# Patient Record
Sex: Female | Born: 1995 | Race: Black or African American | Hispanic: No | Marital: Single | Smoking: Former smoker
Health system: Southern US, Community
[De-identification: ages and names within clinical notes are randomized; demographics above are authoritative.]

## PROBLEM LIST (undated history)

## (undated) DIAGNOSIS — Z789 Other specified health status: Secondary | ICD-10-CM

## (undated) DIAGNOSIS — Z8619 Personal history of other infectious and parasitic diseases: Secondary | ICD-10-CM

## (undated) DIAGNOSIS — J302 Other seasonal allergic rhinitis: Secondary | ICD-10-CM

## (undated) HISTORY — PX: NO PAST SURGERIES: SHX2092

---
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
## 2017-06-05 DIAGNOSIS — Z8619 Personal history of other infectious and parasitic diseases: Secondary | ICD-10-CM

## 2017-06-05 HISTORY — DX: Personal history of other infectious and parasitic diseases: Z86.19

## 2017-08-29 ENCOUNTER — Encounter (HOSPITAL_COMMUNITY): Payer: Self-pay

## 2017-08-29 ENCOUNTER — Emergency Department (HOSPITAL_COMMUNITY)
Admission: EM | Admit: 2017-08-29 | Discharge: 2017-08-29 | Disposition: A | Discharge status: Home / Self Care | Payer: Medicaid Other | Attending: Emergency Medicine | Admitting: Emergency Medicine

## 2017-08-29 ENCOUNTER — Emergency Department (HOSPITAL_COMMUNITY): Payer: Medicaid Other

## 2017-08-29 ENCOUNTER — Other Ambulatory Visit: Payer: Self-pay

## 2017-08-29 DIAGNOSIS — J029 Acute pharyngitis, unspecified: Secondary | ICD-10-CM | POA: Insufficient documentation

## 2017-08-29 DIAGNOSIS — R079 Chest pain, unspecified: Secondary | ICD-10-CM | POA: Diagnosis not present

## 2017-08-29 DIAGNOSIS — F17210 Nicotine dependence, cigarettes, uncomplicated: Secondary | ICD-10-CM | POA: Diagnosis not present

## 2017-08-29 LAB — CBC WITH DIFFERENTIAL/PLATELET
Basophils Absolute: 0 10*3/uL (ref 0.0–0.1)
Basophils Relative: 0 %
EOS PCT: 1 %
Eosinophils Absolute: 0.1 10*3/uL (ref 0.0–0.7)
HCT: 34 % — ABNORMAL LOW (ref 36.0–46.0)
Hemoglobin: 10 g/dL — ABNORMAL LOW (ref 12.0–15.0)
LYMPHS ABS: 2.1 10*3/uL (ref 0.7–4.0)
LYMPHS PCT: 44 %
MCH: 22.8 pg — AB (ref 26.0–34.0)
MCHC: 29.4 g/dL — ABNORMAL LOW (ref 30.0–36.0)
MCV: 77.4 fL — ABNORMAL LOW (ref 78.0–100.0)
MONO ABS: 0.3 10*3/uL (ref 0.1–1.0)
Monocytes Relative: 7 %
Neutro Abs: 2.2 10*3/uL (ref 1.7–7.7)
Neutrophils Relative %: 48 %
PLATELETS: 303 10*3/uL (ref 150–400)
RBC: 4.39 MIL/uL (ref 3.87–5.11)
RDW: 16 % — ABNORMAL HIGH (ref 11.5–15.5)
WBC: 4.7 10*3/uL (ref 4.0–10.5)

## 2017-08-29 LAB — BASIC METABOLIC PANEL
Anion gap: 11 (ref 5–15)
BUN: <5 mg/dL — ABNORMAL LOW (ref 6–20)
CO2: 23 mmol/L (ref 22–32)
Calcium: 9.9 mg/dL (ref 8.9–10.3)
Chloride: 105 mmol/L (ref 101–111)
Creatinine, Ser: 0.59 mg/dL (ref 0.44–1.00)
GFR calc Af Amer: >60 mL/min (ref >60)
Glucose, Bld: 100 mg/dL — ABNORMAL HIGH (ref 65–99)
POTASSIUM: 4 mmol/L (ref 3.5–5.1)
Sodium: 139 mmol/L (ref 135–145)

## 2017-08-29 LAB — RAPID STREP SCREEN (MED CTR MEBANE ONLY): Streptococcus, Group A Screen (Direct): NEGATIVE

## 2017-08-29 LAB — I-STAT TROPONIN, ED: Troponin i, poc: 0 ng/mL (ref 0.00–0.08)

## 2017-08-29 LAB — I-STAT BETA HCG BLOOD, ED (MC, WL, AP ONLY): I-stat hCG, quantitative: <5 m[IU]/mL (ref <5)

## 2017-08-29 LAB — D-DIMER, QUANTITATIVE (NOT AT ARMC)

## 2017-08-29 MED ORDER — KETOROLAC TROMETHAMINE 30 MG/ML IJ SOLN
30.0000 mg | Freq: Once | INTRAMUSCULAR | Status: AC
  Administered 2017-08-29: 30 mg via INTRAMUSCULAR
  Filled 2017-08-29: qty 1

## 2017-08-29 MED ORDER — DEXAMETHASONE SODIUM PHOSPHATE 10 MG/ML IJ SOLN
10.0000 mg | Freq: Once | INTRAMUSCULAR | Status: AC
  Administered 2017-08-29: 10 mg via INTRAMUSCULAR
  Filled 2017-08-29: qty 1

## 2017-08-29 MED ORDER — KETOROLAC TROMETHAMINE 30 MG/ML IJ SOLN
30.0000 mg | Freq: Once | INTRAMUSCULAR | Status: DC
Start: 2017-08-29 — End: 2017-08-29

## 2017-08-29 MED ORDER — DEXAMETHASONE SODIUM PHOSPHATE 10 MG/ML IJ SOLN: 10.0000 mg | Freq: Once | INTRAMUSCULAR | Status: DC

## 2017-08-29 NOTE — ED Notes (Signed)
Attempted IV X2 for blood draw, unable to obtain.

## 2017-08-29 NOTE — Discharge Instructions (Signed)
You can take Tylenol or Ibuprofen as directed for pain. You can alternate Tylenol and Ibuprofen every 4 hours. If you take Tylenol at 1pm, then you can take Ibuprofen at 5pm. Then you can take Tylenol again at 9pm.   [facility].   Return to the Emergency Department immediately if you experiencing worsening chest pain, difficulty breathing, nausea/vomiting, get very sweaty, headache or any other worsening or concerning symptoms.

## 2017-08-29 NOTE — ED Provider Notes (Signed)
[facility] EMERGENCY DEPARTMENT Provider Note   CSN: [id number] Arrival date & time: 08/29/17  1416     History   Chief Complaint No chief complaint on file.   HPI Christina Cummings is a 22 y.o. female who presents for evaluation of chest pain that is been ongoing for 2 days and sore throat that is been ongoing for the last week. Patient states that sore throat has been constant.  She took ibuprofen and Goody's powder with minimal improvement in symptoms.  Patient states that she is able to swallow and tolerate her secretions but does report worsening pain with swallowing.  Patient also comes because she has been having midsternal chest pain that is been ongoing since yesterday.  She states that the pain has been constant in nature.  She states that it is worse with movement.  Patient states that it is also worse when she takes a deep breath in.  Patient states she has not taken anything for the pain.  She denies any associated nausea, vomiting, diaphoresis.  Patient reports that she did smoke marijuana yesterday prior to onset of symptoms.  She denies any cocaine, heroin, marijuana use.  Patient denies any fevers, abdominal pain, nausea/vomiting, numbness/weakness of arms or legs.  denies any OCP use, recent immobilization, prior history of DVT/PE, recent surgery, leg swelling, or long travel.  The history is provided by the patient.    Past Medical History:  Diagnosis Date  . Seasonal allergies     There are no active problems to display for this patient.   History reviewed. No pertinent surgical history.   OB History   None      Home Medications    Prior to Admission medications   Medication Sig Start Date End Date Taking? Authorizing Provider  promethazine (PHENERGAN) 25 MG tablet Take 1 tablet (25 mg total) by mouth every 6 (six) hours as needed for nausea or vomiting. 08/04/16   Antony Madura, PA-C    Family History No family history on file.  Social  History Social History   Tobacco Use  . Smoking status: Current Every Day Smoker    Packs/day: 0.50    Types: Cigarettes  . Smokeless tobacco: Never Used  Substance Use Topics  . Alcohol use: No  . Drug use: No     Allergies   Patient has no known allergies.   Review of Systems Review of Systems  Constitutional: Negative for chills and fever.  HENT: Positive for sore throat. Negative for congestion and rhinorrhea.   Eyes: Negative for visual disturbance.  Respiratory: Negative for cough and shortness of breath.   Cardiovascular: Positive for chest pain.  Gastrointestinal: Negative for abdominal pain, diarrhea, nausea and vomiting.  Genitourinary: Negative for dysuria and hematuria.  Skin: Negative for rash.  Neurological: Negative for dizziness, weakness, numbness and headaches.  Psychiatric/Behavioral: Negative for confusion.  All other systems reviewed and are negative.    Physical Exam Updated Vital Signs BP 95/75 (BP Location: Right Arm)   Pulse 96   Temp 98.3 F (36.8 C) (Oral)   Resp 18   SpO2 98%   Physical Exam  Constitutional: She is oriented to person, place, and time. She appears well-developed and well-nourished.  HENT:  Head: Normocephalic and atraumatic.  Mouth/Throat: Uvula is midline and mucous membranes are normal. No trismus in the jaw. Posterior oropharyngeal erythema present.  Uvula is midline.  No trismus.  Oropharynx is erythematous but no edema, exudates.  No neck or  facial swelling.  Airway is patent, phonation is intact.  Eyes: Pupils are equal, round, and reactive to light. Conjunctivae, EOM and lids are normal.  Neck: Full passive range of motion without pain.  Cardiovascular: Normal rate, regular rhythm, normal heart sounds and normal pulses. Exam reveals no gallop and no friction rub.  No murmur heard. Pulmonary/Chest: Effort normal and breath sounds normal.  No evidence of respiratory distress. Able to speak in full sentences without  difficulty.  Abdominal: Soft. Normal appearance. There is no tenderness. There is no rigidity and no guarding.  Musculoskeletal: Normal range of motion.  Neurological: She is alert and oriented to person, place, and time.  Skin: Skin is warm and dry. Capillary refill takes less than 2 seconds.  Psychiatric: She has a normal mood and affect. Her speech is normal.  Nursing note and vitals reviewed.    ED Treatments / Results  Labs (all labs ordered are listed, but only abnormal results are displayed) Labs Reviewed  CBC WITH DIFFERENTIAL/PLATELET - Abnormal; Notable for the following components:      Result Value   Hemoglobin 10.0 (*)    HCT 34.0 (*)    MCV 77.4 (*)    MCH 22.8 (*)    MCHC 29.4 (*)    RDW 16.0 (*)    All other components within normal limits  BASIC METABOLIC PANEL - Abnormal; Notable for the following components:   Glucose, Bld 100 (*)    BUN <5 (*)    All other components within normal limits  RAPID STREP SCREEN (NOT AT [facility])  CULTURE, GROUP A STREP ([facility])  D-DIMER, QUANTITATIVE (NOT AT [facility])  I-STAT TROPONIN, ED  I-STAT BETA HCG BLOOD, ED ([facility], [facility], [facility] ONLY)    EKG EKG Interpretation  Date/Time:  Wednesday August 29 2017 14:32:09 EDT Ventricular Rate:  105 PR Interval:  142 QRS Duration: 84 QT Interval:  314 QTC Calculation: 415 R Axis:   55 Text Interpretation:  Sinus tachycardia Otherwise normal ECG since last tracing no significant change Confirmed by Mancel Bale [phone]) on 08/29/2017 9:25:20 PM   Radiology Dg Chest 2 View  Result Date: 08/29/2017 CLINICAL DATA:  Chest pain EXAM: CHEST - 2 VIEW COMPARISON:  02/03/2015 FINDINGS: The heart size and mediastinal contours are within normal limits. Both lungs are clear. The visualized skeletal structures are unremarkable. IMPRESSION: No active cardiopulmonary disease. Electronically Signed   By: Alcide Clever M.D.   On: 08/29/2017 18:51    Procedures Procedures (including critical care  time)  Medications Ordered in ED Medications  dexamethasone (DECADRON) injection 10 mg (10 mg Intramuscular Given 08/29/17 2129)  ketorolac (TORADOL) 30 MG/ML injection 30 mg (30 mg Intramuscular Given 08/29/17 2129)     Initial Impression / Assessment and Plan / ED Course  I have reviewed the triage vital signs and the nursing notes.  Pertinent labs & imaging results that were available during my care of the patient were reviewed by me and considered in my medical decision making (see chart for details).     22 y.o. Female who presents for evaluation of sore throat and chest pain.  Sore throat has been going on for a week and chest pain began yesterday.  Worse with movement and deep inspiration.  Reports some associated shortness of breath.  No fevers, nausea/vomiting. Patient is afebrile , non-toxic appearing, sitting comfortably on examination table. Vital signs reviewed and stable.  Posterior oropharynx is slightly erythematous but no evidence of exudates, edema.  No neck or  facial swelling.  History/physical exam is not concerning for Ludwig angina or peritonsillar abscess.  Consider pharyngitis versus tonsillitis.  On exam, Consider musculoskeletal pain versus anxiety versus ACS etiology versus acute infectious etiology.  Low suspicion for PE, though a consideration.  Plan to check basic labs, EKG, chest x-ray.  Rapid strep ordered at triage.  I-STAT beta negative.  Troponin negative.  D-dimer is negative.  CBC shows slight anemia but no evidence of leukocytosis.  Records reviewed to the patient has a baseline history of anemia.  BMP is without any significant acute abnormalities.  Chest x-ray negative for any acute abnormalities.  Rapid strep negative.  Given the patient's symptoms have been constant since yesterday, one troponin is sufficient for ACS rule out.  Doubt ACS etiology as patient has no risk factors.   Discussed results with patient.  Encouraged at home supportive therapy care  measures with patient.  Patient instructed follow-up with her primary care doctor next 24 to 48 hours for further evaluation. Patient had ample opportunity for questions and discussion. All patient's questions were answered with full understanding. Strict return precautions discussed. Patient expresses understanding and agreement to plan.   Final Clinical Impressions(s) / ED Diagnoses   Final diagnoses:  Chest pain, unspecified type    ED Discharge Orders    None       Rosana HoesLayden, Lindsey A, PA-C 08/30/17 Dyann [id number]    Wentz, Elliott, MD 09/01/17 1729

## 2017-08-29 NOTE — ED Triage Notes (Signed)
Patient complains of sore throat x 2 days with sharp cp with movement and inspiration. Alert and oriented, nad

## 2017-08-31 LAB — CULTURE, GROUP A STREP (THRC)

## 2017-10-17 ENCOUNTER — Encounter (HOSPITAL_COMMUNITY): Payer: Self-pay | Admitting: *Deleted

## 2017-10-17 ENCOUNTER — Inpatient Hospital Stay (HOSPITAL_COMMUNITY)
Admission: AD | Admit: 2017-10-17 | Discharge: 2017-10-17 | Disposition: A | Discharge status: Home / Self Care | Payer: Medicaid Other | Source: Ambulatory Visit | Attending: Family Medicine | Admitting: Family Medicine

## 2017-10-17 ENCOUNTER — Telehealth: Payer: Self-pay | Admitting: Licensed Clinical Social Worker

## 2017-10-17 DIAGNOSIS — F17210 Nicotine dependence, cigarettes, uncomplicated: Secondary | ICD-10-CM | POA: Diagnosis not present

## 2017-10-17 DIAGNOSIS — N760 Acute vaginitis: Secondary | ICD-10-CM | POA: Insufficient documentation

## 2017-10-17 DIAGNOSIS — Z202 Contact with and (suspected) exposure to infections with a predominantly sexual mode of transmission: Secondary | ICD-10-CM | POA: Diagnosis present

## 2017-10-17 DIAGNOSIS — B9689 Other specified bacterial agents as the cause of diseases classified elsewhere: Secondary | ICD-10-CM | POA: Diagnosis not present

## 2017-10-17 HISTORY — DX: Personal history of other infectious and parasitic diseases: Z86.19

## 2017-10-17 LAB — URINALYSIS, ROUTINE W REFLEX MICROSCOPIC
Bilirubin Urine: NEGATIVE
GLUCOSE, UA: NEGATIVE mg/dL
Hgb urine dipstick: NEGATIVE
Ketones, ur: NEGATIVE mg/dL
Nitrite: NEGATIVE
PROTEIN: NEGATIVE mg/dL
SPECIFIC GRAVITY, URINE: 1.018 (ref 1.005–1.030)
pH: 7 (ref 5.0–8.0)

## 2017-10-17 LAB — WET PREP, GENITAL
Sperm: NONE SEEN
Trich, Wet Prep: NONE SEEN
YEAST WET PREP: NONE SEEN

## 2017-10-17 LAB — POCT PREGNANCY, URINE: Preg Test, Ur: NEGATIVE

## 2017-10-17 LAB — RPR: RPR Ser Ql: NONREACTIVE

## 2017-10-17 LAB — GC/CHLAMYDIA PROBE AMP (~~LOC~~) NOT AT ARMC
Chlamydia: NEGATIVE
Neisseria Gonorrhea: NEGATIVE

## 2017-10-17 LAB — HIV ANTIBODY (ROUTINE TESTING W REFLEX): HIV Screen 4th Generation wRfx: NONREACTIVE

## 2017-10-17 MED ORDER — METRONIDAZOLE 500 MG PO TABS
500.0000 mg | ORAL_TABLET | Freq: Two times a day (BID) | ORAL | 0 refills | Status: DC
Start: 2017-10-17 — End: 2019-11-19

## 2017-10-17 MED ORDER — AZITHROMYCIN 250 MG PO TABS
1000.0000 mg | ORAL_TABLET | Freq: Once | ORAL | Status: AC
  Administered 2017-10-17: 1000 mg via ORAL
  Filled 2017-10-17: qty 4

## 2017-10-17 MED ORDER — CEFTRIAXONE SODIUM 250 MG IJ SOLR
250.0000 mg | Freq: Once | INTRAMUSCULAR | Status: AC
  Administered 2017-10-17: 250 mg via INTRAMUSCULAR
  Filled 2017-10-17: qty 250

## 2017-10-17 NOTE — MAU Provider Note (Signed)
History     CSN: [id number]  Arrival date and time: 10/17/17 9528   First Provider Initiated Contact with Patient 10/17/17 0220      Chief Complaint  Patient presents with  . Exposure to STD   HPI Christina Cummings is a 22 y.o. non pregnant female who presents for STD exposure. Reports having intercourse with a new partner on Friday. Doesn't not know him well and did not use condom. Was told today by his friends that he has an STD. Does not know what STD he has. Since Monday she has had some thick white vaginal discharge and irritation. Denies abdominal pain or vaginal bleeding. Hx of gonorrhea 5 months ago. Does not use any contraception.   Past Medical History:  Diagnosis Date  . Seasonal allergies     No past surgical history on file.  No family history on file.  Social History   Tobacco Use  . Smoking status: Current Every Day Smoker    Packs/day: 0.50    Types: Cigarettes  . Smokeless tobacco: Never Used  Substance Use Topics  . Alcohol use: No  . Drug use: No    Allergies: No Known Allergies  Medications Prior to Admission  Medication Sig Dispense Refill Last Dose  . promethazine (PHENERGAN) 25 MG tablet Take 1 tablet (25 mg total) by mouth every 6 (six) hours as needed for nausea or vomiting. 12 tablet 0     Review of Systems  Constitutional: Negative.   Gastrointestinal: Negative.   Genitourinary: Positive for vaginal discharge. Negative for genital sores.   Physical Exam   Blood pressure 110/72, pulse 88, temperature 98.6 F (37 C), temperature source Oral, resp. rate 20, height  (1.727 m), weight 181 lb (82.1 kg), last menstrual period 10/05/2017.  Physical Exam  Nursing note and vitals reviewed. Constitutional: She is oriented to person, place, and time. She appears well-developed and well-nourished. No distress.  HENT:  Head: Normocephalic and atraumatic.  Eyes: Conjunctivae are normal. Right eye exhibits no discharge. Left eye exhibits no  discharge. No scleral icterus.  Neck: Normal range of motion.  Respiratory: Effort normal. No respiratory distress.  Neurological: She is alert and oriented to person, place, and time.  Skin: She is not diaphoretic.  Psychiatric: She has a normal mood and affect. Her behavior is normal. Judgment and thought content normal.    MAU Course  Procedures Results for orders placed or performed during the hospital encounter of 10/17/17 (from the past 24 hour(s))  Urinalysis, Routine w reflex microscopic     Status: Abnormal   Collection Time: 10/17/17  1:35 AM  Result Value Ref Range   Color, Urine YELLOW YELLOW   APPearance HAZY (A) CLEAR   Specific Gravity, Urine 1.018 1.005 - 1.030   pH 7.0 5.0 - 8.0   Glucose, UA NEGATIVE NEGATIVE mg/dL   Hgb urine dipstick NEGATIVE NEGATIVE   Bilirubin Urine NEGATIVE NEGATIVE   Ketones, ur NEGATIVE NEGATIVE mg/dL   Protein, ur NEGATIVE NEGATIVE mg/dL   Nitrite NEGATIVE NEGATIVE   Leukocytes, UA TRACE (A) NEGATIVE   RBC / HPF 0-5 0 - 5 RBC/hpf   WBC, UA 0-5 0 - 5 WBC/hpf   Bacteria, UA RARE (A) NONE SEEN   Squamous Epithelial / LPF 6-10 0 - 5   Mucus PRESENT   Wet prep, genital     Status: Abnormal   Collection Time: 10/17/17  1:35 AM  Result Value Ref Range   Yeast Wet Prep HPF POC  NONE SEEN NONE SEEN   Trich, Wet Prep NONE SEEN NONE SEEN   Clue Cells Wet Prep HPF POC PRESENT (A) NONE SEEN   WBC, Wet Prep HPF POC MODERATE (A) NONE SEEN   Sperm NONE SEEN   Pregnancy, urine POC     Status: None   Collection Time: 10/17/17  1:42 AM  Result Value Ref Range   Preg Test, Ur NEGATIVE NEGATIVE    MDM UPT negative GC/CT, wet prep, HIV, RPR  Offered patient GC/CT treatment -- pt requests tx tonight.  Wet prep negative for trich but + BV Discussed LARC with patient as she is 22 yrs old and lives in [place]. Patient is interested.   Assessment and Plan  A: 1. STD exposure   2. BV (bacterial vaginosis)    P: Discharge home Rx  flagyl GC/CT, HIV, RPR pending Candidate for teen LARC -- msg sent to Sue Lush No intercourse x 2 weeks Discussed safe sex practices & use of condoms  Judeth Horn 10/17/2017, 2:20 AM

## 2017-10-17 NOTE — Discharge Instructions (Signed)
Contraception Choices Contraception, also called birth control, refers to methods or devices that prevent pregnancy. Hormonal methods Contraceptive implant A contraceptive implant is a thin, plastic tube that contains a hormone. It is inserted into the upper part of the arm. It can remain in place for up to 3 years. Progestin-only injections Progestin-only injections are injections of progestin, a synthetic form of the hormone progesterone. They are given every 3 months by a health care provider. Birth control pills Birth control pills are pills that contain hormones that prevent pregnancy. They must be taken once a day, preferably at the same time each day. Birth control patch The birth control patch contains hormones that prevent pregnancy. It is placed on the skin and must be changed once a week for three weeks and removed on the fourth week. A prescription is needed to use this method of contraception. Vaginal ring A vaginal ring contains hormones that prevent pregnancy. It is placed in the vagina for three weeks and removed on the fourth week. After that, the process is repeated with a new ring. A prescription is needed to use this method of contraception. Emergency contraceptive Emergency contraceptives prevent pregnancy after unprotected sex. They come in pill form and can be taken up to 5 days after sex. They work best the sooner they are taken after having sex. Most emergency contraceptives are available without a prescription. This method should not be used as your only form of birth control. Barrier methods Female condom A female condom is a thin sheath that is worn over the penis during sex. Condoms keep sperm from going inside a woman's body. They can be used with a spermicide to increase their effectiveness. They should be disposed after a single use. Female condom A female condom is a soft, loose-fitting sheath that is put into the vagina before sex. The condom keeps sperm from going  inside a woman's body. They should be disposed after a single use. Diaphragm A diaphragm is a soft, dome-shaped barrier. It is inserted into the vagina before sex, along with a spermicide. The diaphragm blocks sperm from entering the uterus, and the spermicide kills sperm. A diaphragm should be left in the vagina for 6-8 hours after sex and removed within 24 hours. A diaphragm is prescribed and fitted by a health care provider. A diaphragm should be replaced every 1-2 years, after giving birth, after gaining more than 15 lb (6.8 kg), and after pelvic surgery. Cervical cap A cervical cap is a round, soft latex or plastic cup that fits over the cervix. It is inserted into the vagina before sex, along with spermicide. It blocks sperm from entering the uterus. The cap should be left in place for 6-8 hours after sex and removed within 48 hours. A cervical cap must be prescribed and fitted by a health care provider. It should be replaced every 2 years. Sponge A sponge is a soft, circular piece of polyurethane foam with spermicide on it. The sponge helps block sperm from entering the uterus, and the spermicide kills sperm. To use it, you make it wet and then insert it into the vagina. It should be inserted before sex, left in for at least 6 hours after sex, and removed and thrown away within 30 hours. Spermicides Spermicides are chemicals that kill or block sperm from entering the cervix and uterus. They can come as a cream, jelly, suppository, foam, or tablet. A spermicide should be inserted into the vagina with an applicator at least 10-27 minutes before  sex to allow time for it to work. The process must be repeated every time you have sex. Spermicides do not require a prescription. Intrauterine contraception Intrauterine device (IUD) An IUD is a T-shaped device that is put in a woman's uterus. There are two types:  Hormone IUD.This type contains progestin, a synthetic form of the hormone progesterone. This  type can stay in place for 3-5 years.  Copper IUD.This type is wrapped in copper wire. It can stay in place for 10 years.  Permanent methods of contraception Female tubal ligation In this method, a woman's fallopian tubes are sealed, tied, or blocked during surgery to prevent eggs from traveling to the uterus. Hysteroscopic sterilization In this method, a small, flexible insert is placed into each fallopian tube. The inserts cause scar tissue to form in the fallopian tubes and block them, so sperm cannot reach an egg. The procedure takes about 3 months to be effective. Another form of birth control must be used during those 3 months. Female sterilization This is a procedure to tie off the tubes that carry sperm (vasectomy). After the procedure, the man can still ejaculate fluid (semen). Natural planning methods Natural family planning In this method, a couple does not have sex on days when the woman could become pregnant. Calendar method This means keeping track of the length of each menstrual cycle, identifying the days when pregnancy can happen, and not having sex on those days. Ovulation method In this method, a couple avoids sex during ovulation. Symptothermal method This method involves not having sex during ovulation. The woman typically checks for ovulation by watching changes in her temperature and in the consistency of cervical mucus. Post-ovulation method In this method, a couple waits to have sex until after ovulation. Summary  Contraception, also called birth control, means methods or devices that prevent pregnancy.  Hormonal methods of contraception include implants, injections, pills, patches, vaginal rings, and emergency contraceptives.  Barrier methods of contraception can include female condoms, female condoms, diaphragms, cervical caps, sponges, and spermicides.  There are two types of IUDs (intrauterine devices). An IUD can be put in a woman's uterus to prevent pregnancy  for 3-5 years.  Permanent sterilization can be done through a procedure for males, females, or both.  Natural family planning methods involve not having sex on days when the woman could become pregnant. This information is not intended to replace advice given to you by your health care provider. Make sure you discuss any questions you have with your health care provider. Document Released: 05/22/2005 Document Revised: 06/24/2016 Document Reviewed: 06/24/2016 Elsevier Interactive Patient Education  2018 [employer] Inc[employer]      Bacterial Vaginosis Bacterial vaginosis is an infection of the vagina. It happens when too many germs (bacteria) grow in the vagina. This infection puts you at risk for infections from sex (STIs). Treating this infection can lower your risk for some STIs. You should also treat this if you are pregnant. It can cause your baby to be born early. Follow these instructions at home: Medicines  Take over-the-counter and prescription medicines only as told by your doctor.  Take or use your antibiotic medicine as told by your doctor. Do not stop taking or using it even if you start to feel better. General instructions  If you your sexual partner is a woman, tell her that you have this infection. She needs to get treatment if she has symptoms. If you have a female partner, he does not need to be treated.  During  treatment: ? Avoid sex. ? Do not douche. ? Avoid alcohol as told. ? Avoid breastfeeding as told.  Drink enough fluid to keep your pee (urine) clear or pale yellow.  Keep your vagina and butt (rectum) clean. ? Wash the area with warm water every day. ? Wipe from front to back after you use the toilet.  Keep all follow-up visits as told by your doctor. This is important. Preventing this condition  Do not douche.  Use only warm water to wash around your vagina.  Use protection when you have sex. This includes: ? Latex condoms. ? Dental dams.  Limit how many  people you have sex with. It is best to only have sex with the same person (be monogamous).  Get tested for STIs. Have your partner get tested.  Wear underwear that is cotton or lined with cotton.  Avoid tight pants and pantyhose. This is most important in summer.  Do not use any products that have nicotine or tobacco in them. These include cigarettes and e-cigarettes. If you need help quitting, ask your doctor.  Do not use illegal drugs.  Limit how much alcohol you drink. Contact a doctor if:  Your symptoms do not get better, even after you are treated.  You have more discharge or pain when you pee (urinate).  You have a fever.  You have pain in your belly (abdomen).  You have pain with sex.  Your bleed from your vagina between periods. Summary  This infection happens when too many germs (bacteria) grow in the vagina.  Treating this condition can lower your risk for some infections from sex (STIs).  You should also treat this if you are pregnant. It can cause early (premature) birth.  Do not stop taking or using your antibiotic medicine even if you start to feel better. This information is not intended to replace advice given to you by your health care provider. Make sure you discuss any questions you have with your health care provider. Document Released: 02/29/2008 Document Revised: 02/05/2016 Document Reviewed: 02/05/2016 Elsevier Interactive Patient Education  2017 [employer].    Safe Sex Practicing safe sex means taking steps before and during sex to reduce your risk of:  Getting an STD (sexually transmitted disease).  Giving your partner an STD.  Unwanted pregnancy.  How can I practice safe sex?  To practice safe sex:  Limit your sexual partners to only one partner who is having sex with only you.  Avoid using alcohol and recreational drugs before having sex. These substances can affect your judgment.  Before having sex with a new partner: ? Talk  to your partner about past partners, past STDs, and drug use. ? You and your partner should be screened for STDs and discuss the results with each other.  Check your body regularly for sores, blisters, rashes, or unusual discharge. If you notice any of these problems, visit your health care provider.  If you have symptoms of an infection or you are being treated for an STD, avoid sexual contact.  While having sex, use a condom. Make sure to: ? Use a condom every time you have vaginal, oral, or anal sex. Both females and males should wear condoms during oral sex. ? Keep condoms in place from the beginning to the end of sexual activity. ? Use a latex condom, if possible. Latex condoms offer the best protection. ? Use only water-based lubricants or oils to lubricate a condom. Using petroleum-based lubricants or oils will weaken the  condom and increase the chance that it will break.  See your health care provider for regular screenings, exams, and tests for STDs.  Talk with your health care provider about the form of birth control (contraception) that is best for you.  Get vaccinated against hepatitis B and human papillomavirus (HPV).  If you are at risk of being infected with HIV (human immunodeficiency virus), talk with your health care provider about taking a prescription medicine to prevent HIV infection. You are considered at risk for HIV if: ? You are a man who has sex with other men. ? You are a heterosexual man or woman who is sexually active with more than one partner. ? You take drugs by injection. ? You are sexually active with a partner who has HIV.  This information is not intended to replace advice given to you by your health care provider. Make sure you discuss any questions you have with your health care provider. Document Released: 06/29/2004 Document Revised: 10/06/2015 Document Reviewed: 04/11/2015 Elsevier Interactive Patient Education  [employer].

## 2017-10-17 NOTE — MAU Note (Signed)
PT SAYS  SHE STARTED HAVING VAG ITCHING ON Monday.  NO MEDS.   SAYS  SHE HAD SEX ON Friday  WITH SOME MAN- HIS FRIENDS  TOLD HER TODAY - THAT HE HAD STD-  UNSURE OF WHICH .    PT SAYS SHE HAS NOT HAD SEX  WITH ANYONE SINCE .

## 2017-10-17 NOTE — Telephone Encounter (Signed)
CSW A. Felton Clinton contacted pt regarding recent visit to MAU for possible STD exposure. Message received from MAU provider Estanislado Spire regarding pt interest in LARC contraception. Pt reports a desire for larc however due to recent infection it is best pt waits until infections clears then make appt within two weeks for f/u and further discussion regarding LARC.

## 2017-11-01 ENCOUNTER — Telehealth: Payer: Self-pay | Admitting: Licensed Clinical Social Worker

## 2017-11-01 NOTE — Telephone Encounter (Signed)
Left detailed message regarding follow up visit scheduled for 11/05/2017 

## 2017-11-05 ENCOUNTER — Other Ambulatory Visit: Payer: Self-pay | Admitting: Obstetrics and Gynecology

## 2017-11-05 ENCOUNTER — Ambulatory Visit: Payer: Medicaid Other

## 2017-11-05 MED ORDER — CYCLOBENZAPRINE HCL 10 MG PO TABS: 10.0000 mg | ORAL_TABLET | Freq: Once | ORAL | 0 refills | Status: AC

## 2017-11-05 NOTE — Progress Notes (Signed)
Flexeril 10 mg PO to be taken 3-4 hours prior to IUD placement   Rasch, Harolyn RutherfordJennifer I, NP 11/05/2017 10:51 AM

## 2017-11-16 ENCOUNTER — Telehealth: Payer: Self-pay

## 2017-11-16 NOTE — Telephone Encounter (Signed)
Called pt left message for pt to return call to reschedule No show appt today with Antony Odeaaroline Neil for GYN and IUD.

## 2017-12-07 ENCOUNTER — Emergency Department (HOSPITAL_COMMUNITY)
Admission: EM | Admit: 2017-12-07 | Discharge: 2017-12-07 | Disposition: A | Discharge status: Home / Self Care | Payer: Medicaid Other | Attending: Emergency Medicine | Admitting: Emergency Medicine

## 2017-12-07 ENCOUNTER — Encounter (HOSPITAL_COMMUNITY): Payer: Self-pay | Admitting: *Deleted

## 2017-12-07 DIAGNOSIS — R21 Rash and other nonspecific skin eruption: Secondary | ICD-10-CM | POA: Diagnosis present

## 2017-12-07 DIAGNOSIS — Z87891 Personal history of nicotine dependence: Secondary | ICD-10-CM | POA: Insufficient documentation

## 2017-12-07 NOTE — Discharge Instructions (Signed)
Should symptoms fail to resolve, follow-up with a primary care provider or dermatologist.

## 2017-12-07 NOTE — ED Provider Notes (Signed)
[facility]-EMERGENCY DEPT Provider Note   CSN: [id number] Arrival date & time: 12/07/17  1712     History   Chief Complaint Chief Complaint  Patient presents with  . Rash    HPI Christina Cummings is a 22 y.o. female.  HPI   Christina RaymondChekia L Oetken is a 22 y.o. female, presenting to the ED with areas of increased pigment over the last 2 weeks to the chest, arms, and legs.   She denies any medications, soaps, or skin care products.  However, she has began using a new make-up.  Denies hormone therapy. Denies fever, pain, itching, difficulty breathing or swallowing, cough, or any other complaints.   Past Medical History:  Diagnosis Date  . Hx of gonorrhea 06/2017  . Seasonal allergies     There are no active problems to display for this patient.   History reviewed. No pertinent surgical history.   OB History    Gravida  0   Para  0   Term  0   Preterm  0   AB  0   Living  0     SAB  0   TAB  0   Ectopic  0   Multiple  0   Live Births  0            Home Medications    Prior to Admission medications   Medication Sig Start Date End Date Taking? Authorizing Provider  metroNIDAZOLE (FLAGYL) 500 MG tablet Take 1 tablet (500 mg total) by mouth 2 (two) times daily. 10/17/17   Judeth HornLawrence, Erin, NP  promethazine (PHENERGAN) 25 MG tablet Take 1 tablet (25 mg total) by mouth every 6 (six) hours as needed for nausea or vomiting. 08/04/16   Antony MaduraHumes, Kelly, PA-C    Family History No family history on file.  Social History Social History   Tobacco Use  . Smoking status: Former Smoker    Packs/day: 0.50    Types: Cigarettes  . Smokeless tobacco: Never Used  Substance Use Topics  . Alcohol use: No  . Drug use: No     Allergies   Patient has no known allergies.   Review of Systems Review of Systems  Constitutional: Negative for fever.  Skin: Positive for rash.     Physical Exam Updated Vital Signs BP 120/77   Pulse (!) 109   Temp  98.6 F (37 C) (Oral)   Resp 18   LMP 11/27/2017   SpO2 97%   Physical Exam  Constitutional: She appears well-developed and well-nourished. No distress.  HENT:  Head: Normocephalic and atraumatic.  Eyes: Conjunctivae are normal.  Neck: Neck supple.  Cardiovascular: Normal rate and regular rhythm.  Not tachycardic on my exam.  Pulmonary/Chest: Effort normal.  Lymphadenopathy:    She has no cervical adenopathy.  Neurological: She is alert.  Skin: Skin is warm and dry. She is not diaphoretic. No pallor.  I am only able to discern one pinpoint area of hyperpigmentation.  In the other regions the patient pointed out, I cannot discern any abnormalities.  Psychiatric: She has a normal mood and affect. Her behavior is normal.  Nursing note and vitals reviewed.    ED Treatments / Results  Labs (all labs ordered are listed, but only abnormal results are displayed) Labs Reviewed - No data to display  EKG None  Radiology No results found.  Procedures Procedures (including critical care time)  Medications Ordered in ED Medications - No data to display  Initial Impression / Assessment and Plan / ED Course  I have reviewed the triage vital signs and the nursing notes.  Pertinent labs & imaging results that were available during my care of the patient were reviewed by me and considered in my medical decision making (see chart for details).     Patient presents with what she refers to as a rash.  She has no additional symptoms with it.  I had no significantly discernible findings on my exam.  PCP versus dermatology follow-up.  Final Clinical Impressions(s) / ED Diagnoses   Final diagnoses:  Rash    ED Discharge Orders    None       Concepcion Living 12/07/17 2159    Little, Ambrose Finland, MD 12/09/17 1650

## 2017-12-07 NOTE — ED Triage Notes (Signed)
Pt complains of hives all over her body for the past 2 weeks. Pt denies any new cosmetics or detergents. Pt denies itching.

## 2018-04-17 ENCOUNTER — Emergency Department (HOSPITAL_COMMUNITY)
Admission: EM | Admit: 2018-04-17 | Discharge: 2018-04-17 | Disposition: A | Discharge status: Home / Self Care | Payer: Medicaid Other | Attending: Emergency Medicine | Admitting: Emergency Medicine

## 2018-04-17 ENCOUNTER — Encounter (HOSPITAL_COMMUNITY): Payer: Self-pay | Admitting: Emergency Medicine

## 2018-04-17 ENCOUNTER — Other Ambulatory Visit: Payer: Self-pay

## 2018-04-17 DIAGNOSIS — E860 Dehydration: Secondary | ICD-10-CM

## 2018-04-17 DIAGNOSIS — R110 Nausea: Secondary | ICD-10-CM | POA: Insufficient documentation

## 2018-04-17 DIAGNOSIS — Z87891 Personal history of nicotine dependence: Secondary | ICD-10-CM | POA: Diagnosis not present

## 2018-04-17 DIAGNOSIS — R42 Dizziness and giddiness: Secondary | ICD-10-CM | POA: Diagnosis present

## 2018-04-17 DIAGNOSIS — D509 Iron deficiency anemia, unspecified: Secondary | ICD-10-CM

## 2018-04-17 LAB — COMPREHENSIVE METABOLIC PANEL
ALBUMIN: 4.7 g/dL (ref 3.5–5.0)
ALT: 10 U/L (ref 0–44)
AST: 22 U/L (ref 15–41)
Alkaline Phosphatase: 44 U/L (ref 38–126)
Anion gap: 8 (ref 5–15)
BUN: 11 mg/dL (ref 6–20)
CHLORIDE: 101 mmol/L (ref 98–111)
CO2: 25 mmol/L (ref 22–32)
Calcium: 9.2 mg/dL (ref 8.9–10.3)
Creatinine, Ser: 0.77 mg/dL (ref 0.44–1.00)
GFR calc non Af Amer: >60 mL/min (ref >60)
Glucose, Bld: 100 mg/dL — ABNORMAL HIGH (ref 70–99)
Potassium: 3.5 mmol/L (ref 3.5–5.1)
SODIUM: 134 mmol/L — AB (ref 135–145)
Total Bilirubin: 0.4 mg/dL (ref 0.3–1.2)
Total Protein: 8.8 g/dL — ABNORMAL HIGH (ref 6.5–8.1)

## 2018-04-17 LAB — URINALYSIS, ROUTINE W REFLEX MICROSCOPIC
Bilirubin Urine: NEGATIVE
GLUCOSE, UA: NEGATIVE mg/dL
Hgb urine dipstick: NEGATIVE
KETONES UR: 5 mg/dL — AB
Leukocytes, UA: NEGATIVE
NITRITE: NEGATIVE
PH: 6 (ref 5.0–8.0)
Protein, ur: 30 mg/dL — AB
Specific Gravity, Urine: 1.021 (ref 1.005–1.030)

## 2018-04-17 LAB — CBC WITH DIFFERENTIAL/PLATELET
ABS IMMATURE GRANULOCYTES: 0.02 10*3/uL (ref 0.00–0.07)
BASOS ABS: 0 10*3/uL (ref 0.0–0.1)
Basophils Relative: 1 %
Eosinophils Absolute: 0.1 10*3/uL (ref 0.0–0.5)
Eosinophils Relative: 2 %
HCT: 36.4 % (ref 36.0–46.0)
HEMOGLOBIN: 10.3 g/dL — AB (ref 12.0–15.0)
IMMATURE GRANULOCYTES: 0 %
LYMPHS PCT: 25 %
Lymphs Abs: 1.1 10*3/uL (ref 0.7–4.0)
MCH: 22.1 pg — ABNORMAL LOW (ref 26.0–34.0)
MCHC: 28.3 g/dL — AB (ref 30.0–36.0)
MCV: 77.9 fL — ABNORMAL LOW (ref 80.0–100.0)
Monocytes Absolute: 0.3 10*3/uL (ref 0.1–1.0)
Monocytes Relative: 7 %
NEUTROS PCT: 65 %
NRBC: 0 % (ref 0.0–0.2)
Neutro Abs: 2.9 10*3/uL (ref 1.7–7.7)
Platelets: 239 10*3/uL (ref 150–400)
RBC: 4.67 MIL/uL (ref 3.87–5.11)
RDW: 16.2 % — ABNORMAL HIGH (ref 11.5–15.5)
WBC: 4.5 10*3/uL (ref 4.0–10.5)

## 2018-04-17 LAB — PREGNANCY, URINE: Preg Test, Ur: NEGATIVE

## 2018-04-17 MED ORDER — FERROUS SULFATE 325 (65 FE) MG PO TABS: 325.0000 mg | ORAL_TABLET | Freq: Every day | ORAL | 0 refills | Status: DC

## 2018-04-17 MED ORDER — SODIUM CHLORIDE 0.9 % IV BOLUS
1000.0000 mL | Freq: Once | INTRAVENOUS | Status: AC
  Administered 2018-04-17: 1000 mL via INTRAVENOUS

## 2018-04-17 NOTE — ED Triage Notes (Signed)
Pt BIB [facility] from work for dizziness and nausea. Pt reports this was her first shift on days, normally works nights. Pt denies eating breakfast. Pt was "packing" felt dizzy, went to bathroom, sat down and felt better, went back to work and began to feel dizzy again. Pt now feeling better, ambulatory upon arrival.

## 2018-04-17 NOTE — ED Provider Notes (Signed)
[facility]-EMERGENCY DEPT Provider Note   CSN: [id number] Arrival date & time: 04/17/18  0810     History   Chief Complaint Chief Complaint  Patient presents with  . Dizziness  . Nausea    HPI Christina Cummings is a 22 y.o. female.  Pt presents to the ED today with dizziness.  The pt said she arrived at work at [employer]0530 which is her normal work start time.  She did not eat breakfast, but that is not unusual.  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  She denies any n/v.  She said she sat down and is feeling better now.  She denies any pain.     Past Medical History:  Diagnosis Date  . Hx of gonorrhea 06/2017  . Seasonal allergies     There are no active problems to display for this patient.   History reviewed. No pertinent surgical history.   OB History    Gravida  0   Para  0   Term  0   Preterm  0   AB  0   Living  0     SAB  0   TAB  0   Ectopic  0   Multiple  0   Live Births  0            Home Medications    Prior to Admission medications   Medication Sig Start Date End Date Taking? Authorizing Provider  ferrous sulfate 325 (65 FE) MG tablet Take 1 tablet (325 mg total) by mouth daily. 04/17/18   Jacalyn LefevreHaviland, Benjimin Hadden, MD  metroNIDAZOLE (FLAGYL) 500 MG tablet Take 1 tablet (500 mg total) by mouth 2 (two) times daily. Patient not taking: Reported on 04/17/2018 10/17/17   Judeth HornLawrence, Erin, NP  promethazine (PHENERGAN) 25 MG tablet Take 1 tablet (25 mg total) by mouth every 6 (six) hours as needed for nausea or vomiting. Patient not taking: Reported on 04/17/2018 08/04/16   Antony MaduraHumes, Kelly, PA-C    Family History History reviewed. No pertinent family history.  Social History Social History   Tobacco Use  . Smoking status: Former Smoker    Packs/day: 0.50    Types: Cigarettes  . Smokeless tobacco: Never Used  Substance Use Topics  . Alcohol use: No  . Drug use: No     Allergies   Patient has no  known allergies.   Review of Systems Review of Systems  Neurological: Positive for dizziness.  All other systems reviewed and are negative.    Physical Exam Updated Vital Signs BP 116/68 (BP Location: Right Arm)   Pulse 94   Temp 98.6 F (37 C) (Oral)   Resp 18   Ht 5\' 8"  (1.727 m)   Wt 86.2 kg   LMP 03/19/2018 (Exact Date)   SpO2 100% Comment: RA  BMI 28.89 kg/m   Physical Exam  Constitutional: She is oriented to person, place, and time. She appears well-developed and well-nourished.  HENT:  Head: Normocephalic and atraumatic.  Right Ear: External ear normal.  Left Ear: External ear normal.  Nose: Nose normal.  Mouth/Throat: Oropharynx is clear and moist.  Eyes: Pupils are equal, round, and reactive to light. Conjunctivae and EOM are normal.  Neck: Normal range of motion. Neck supple.  Cardiovascular: Normal rate, regular rhythm, normal heart sounds and intact distal pulses.  Pulmonary/Chest: Effort normal and breath sounds normal.  Abdominal: Soft. Bowel sounds are normal.  Musculoskeletal: Normal range of  motion.  Neurological: She is alert and oriented to person, place, and time.  Skin: Skin is warm. Capillary refill takes less than 2 seconds.  Psychiatric: She has a normal mood and affect. Her behavior is normal. Judgment and thought content normal.  Nursing note and vitals reviewed.    ED Treatments / Results  Labs (all labs ordered are listed, but only abnormal results are displayed) Labs Reviewed  COMPREHENSIVE METABOLIC PANEL - Abnormal; Notable for the following components:      Result Value   Sodium 134 (*)    Glucose, Bld 100 (*)    Total Protein 8.8 (*)    All other components within normal limits  CBC WITH DIFFERENTIAL/PLATELET - Abnormal; Notable for the following components:   Hemoglobin 10.3 (*)    MCV 77.9 (*)    MCH 22.1 (*)    MCHC 28.3 (*)    RDW 16.2 (*)    All other components within normal limits  URINALYSIS, ROUTINE W REFLEX  MICROSCOPIC - Abnormal; Notable for the following components:   APPearance HAZY (*)    Ketones, ur 5 (*)    Protein, ur 30 (*)    Bacteria, UA RARE (*)    All other components within normal limits  PREGNANCY, URINE    EKG EKG Interpretation  Date/Time:  Wednesday April 17 2018 08:32:25 EST Ventricular Rate:  84 PR Interval:    QRS Duration: 99 QT Interval:  336 QTC Calculation: 398 R Axis:   42 Text Interpretation:  Age not entered, assumed to be  22 years old for purpose of ECG interpretation Sinus rhythm Baseline wander in lead(s) V5 No significant change since last tracing Confirmed by Jacalyn Lefevre [phone]) on 04/17/2018 8:39:11 AM   Radiology No results found.  Procedures Procedures (including critical care time)  Medications Ordered in ED Medications  sodium chloride 0.9 % bolus 1,000 mL (1,000 mLs Intravenous New Bag/Given 04/17/18 0906)     Initial Impression / Assessment and Plan / ED Course  I have reviewed the triage vital signs and the nursing notes.  Pertinent labs & imaging results that were available during my care of the patient were reviewed by me and considered in my medical decision making (see chart for details).    Pt is feeling better after IVFs.  The pt does have anemia, but it looks chronic.  Pt said she's not been told she has anemia.  She does have heavy periods which is likely the source.  She will be started on iron pills and instructed to eat iron rich foods.  She is to f[facility]u with [facility] clinic and establish pcp.  Return if worse.  Final Clinical Impressions(s) / ED Diagnoses   Final diagnoses:  Dehydration  Iron deficiency anemia, unspecified iron deficiency anemia type    ED Discharge Orders         Ordered    ferrous sulfate 325 (65 FE) MG tablet  Daily     04/17/18 0947           Jacalyn Lefevre, MD 04/17/18 [phone]

## 2019-04-08 IMAGING — DX DG CHEST 2V
2 series · 2 of 2 positions shown · non-contrast
Comparison: 02/03/2015

CLINICAL DATA: Chest pain

EXAM:
CHEST - 2 VIEW

[chest pa]
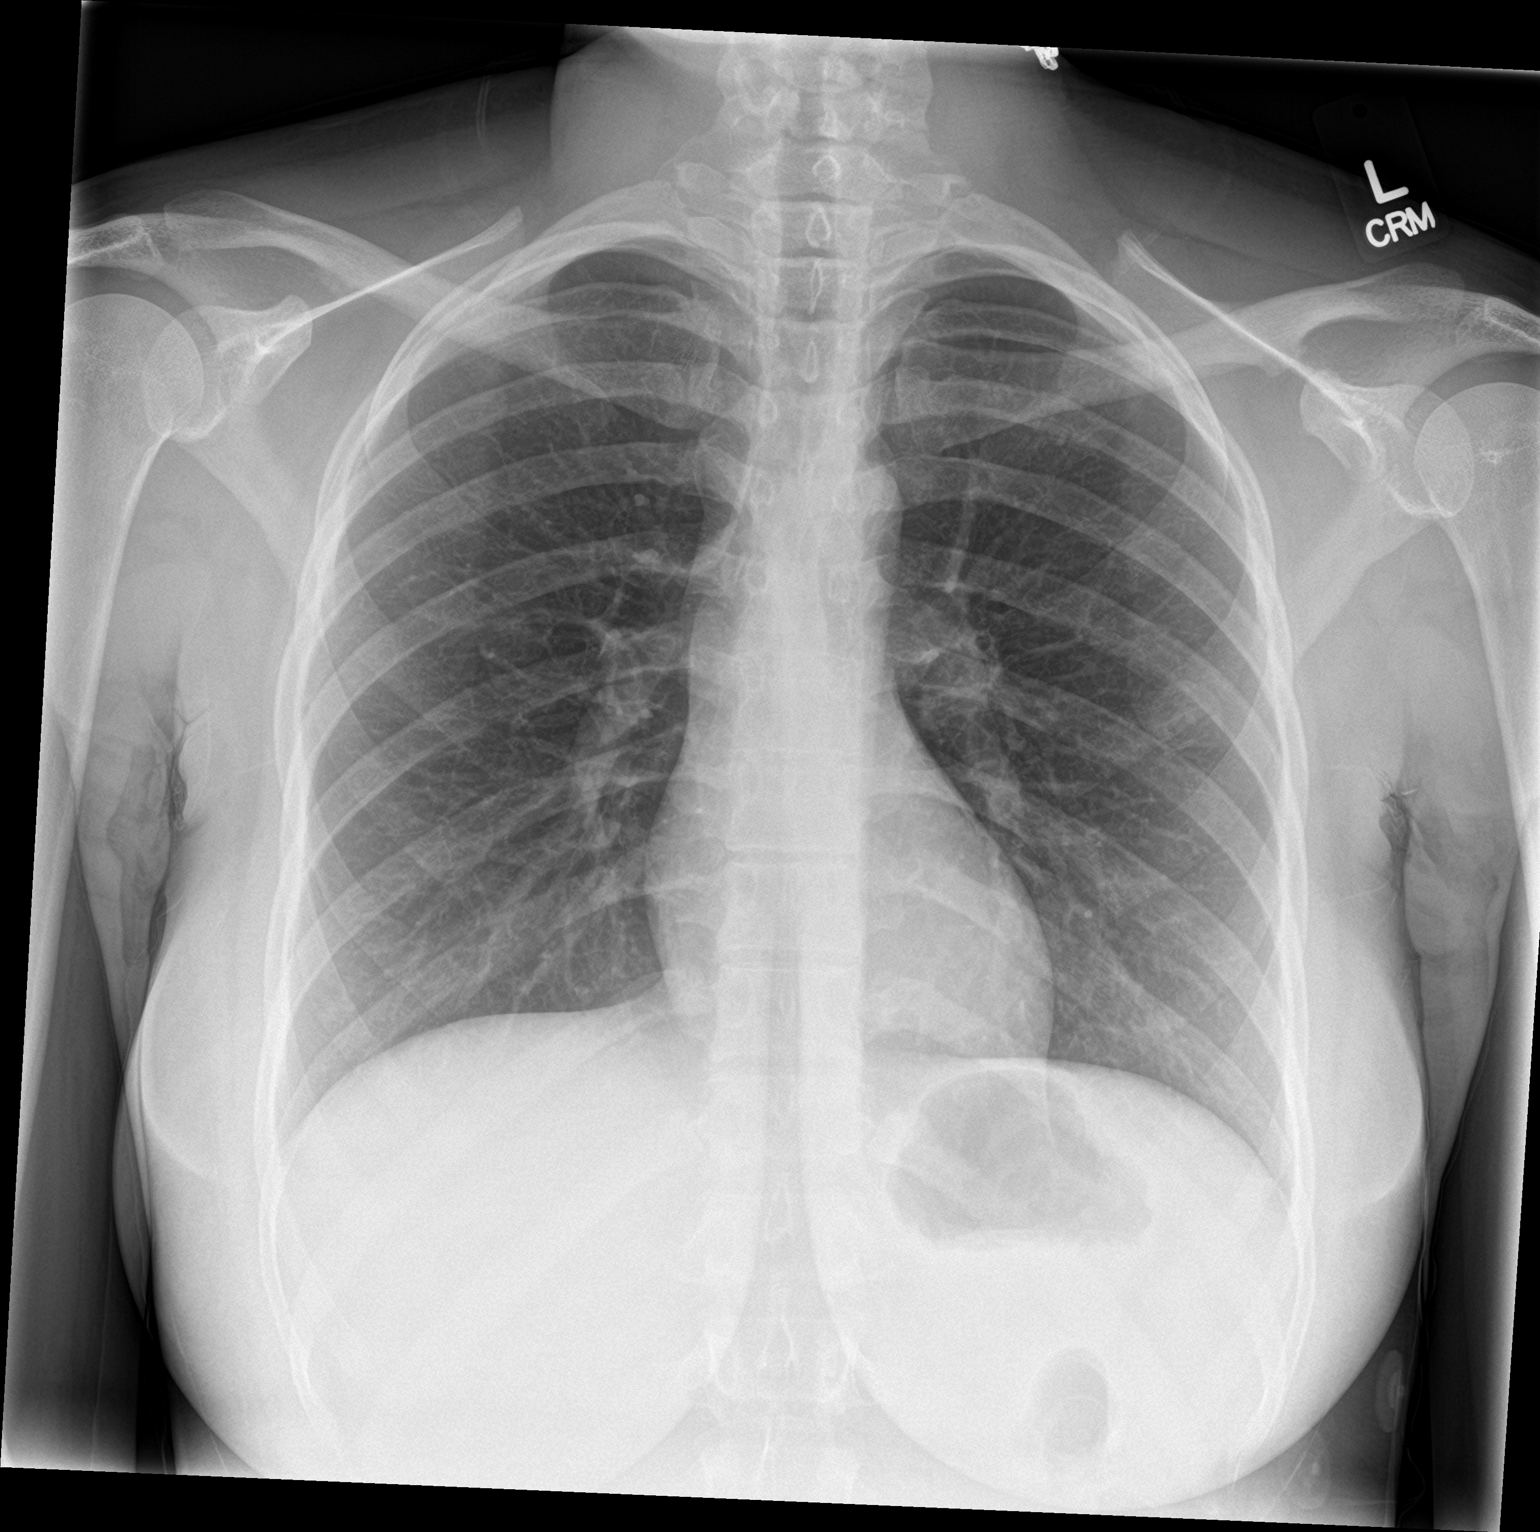

[chest lat]
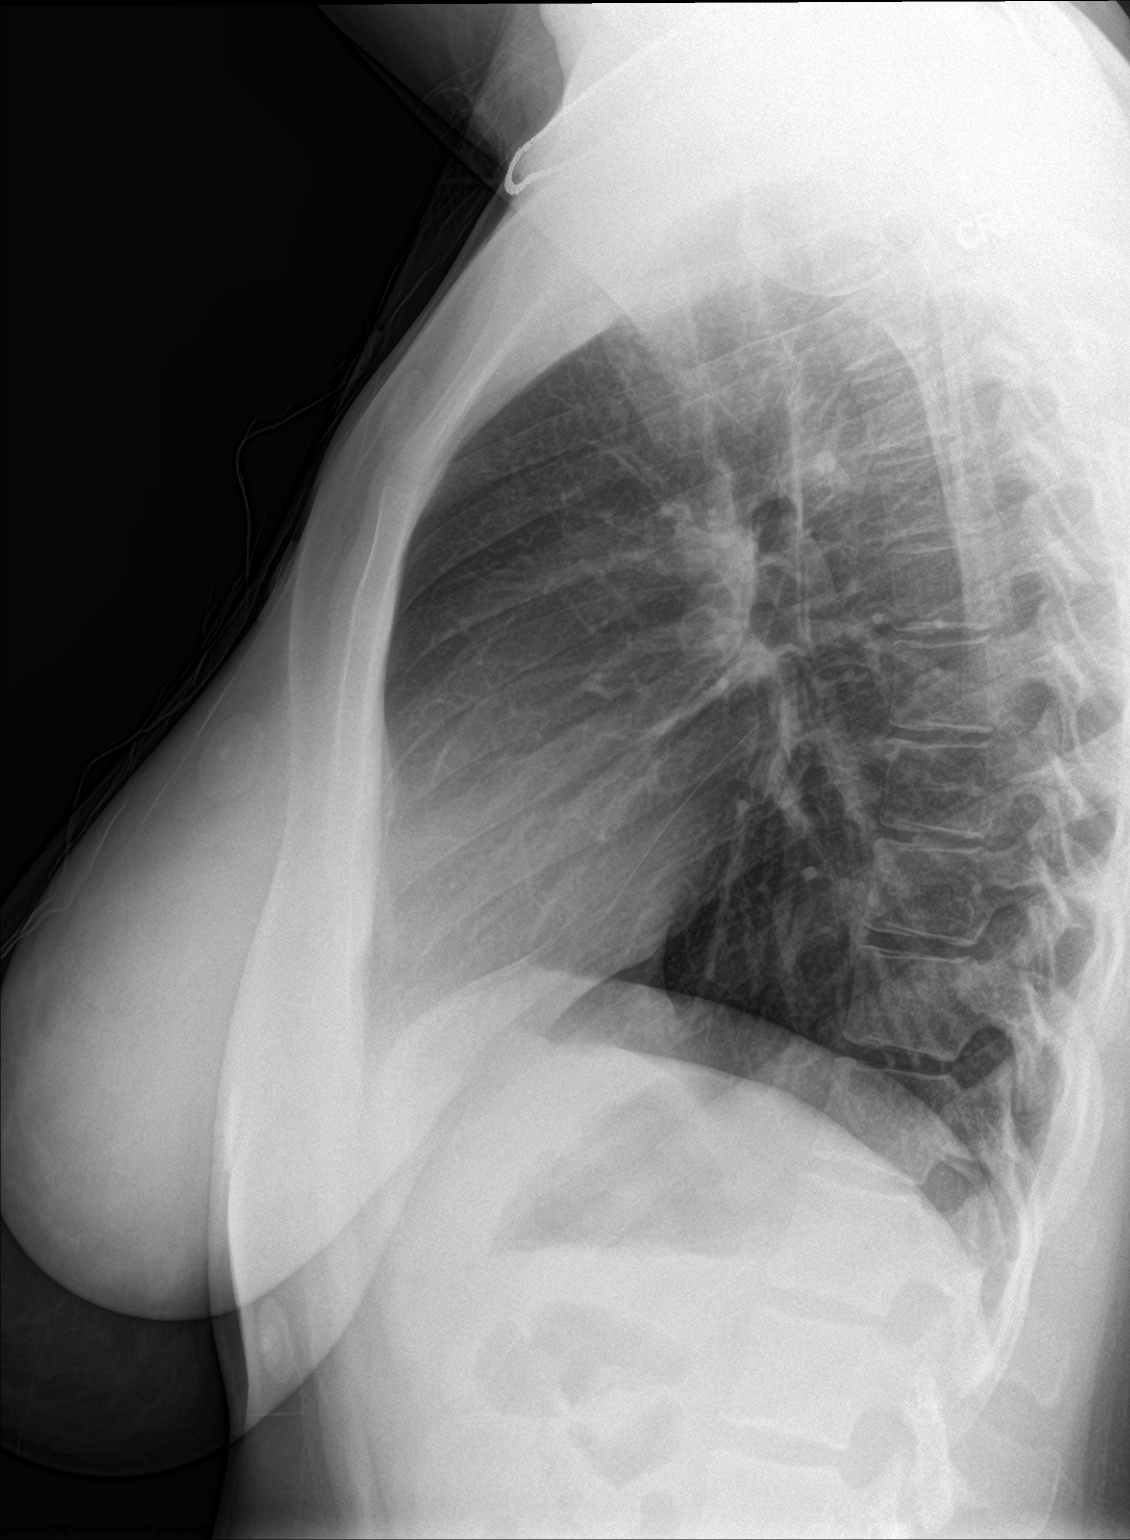

[2 of 2 positions shown; findings below may reference images not displayed]

FINDINGS: The heart size and mediastinal contours are within normal limits.
Both lungs are clear. The visualized skeletal structures are
unremarkable.
IMPRESSION: No active cardiopulmonary disease.

## 2019-11-18 ENCOUNTER — Other Ambulatory Visit: Payer: Self-pay

## 2019-11-18 ENCOUNTER — Emergency Department (HOSPITAL_COMMUNITY)
Admission: EM | Admit: 2019-11-18 | Discharge: 2019-11-19 | Discharge status: Against Medical Advice | Payer: Medicaid Other | Attending: Emergency Medicine | Admitting: Emergency Medicine

## 2019-11-18 ENCOUNTER — Encounter (HOSPITAL_COMMUNITY): Payer: Self-pay | Admitting: Emergency Medicine

## 2019-11-18 DIAGNOSIS — Z5321 Procedure and treatment not carried out due to patient leaving prior to being seen by health care provider: Secondary | ICD-10-CM | POA: Insufficient documentation

## 2019-11-18 DIAGNOSIS — L539 Erythematous condition, unspecified: Secondary | ICD-10-CM | POA: Diagnosis present

## 2019-11-18 NOTE — ED Triage Notes (Signed)
Pt c/o abscess under right axilla x 1 week.

## 2019-11-19 ENCOUNTER — Other Ambulatory Visit: Payer: Self-pay

## 2019-11-19 ENCOUNTER — Emergency Department (HOSPITAL_COMMUNITY)
Admission: EM | Admit: 2019-11-19 | Discharge: 2019-11-19 | Disposition: A | Discharge status: Home / Self Care | Payer: Medicaid Other | Attending: Emergency Medicine | Admitting: Emergency Medicine

## 2019-11-19 ENCOUNTER — Encounter (HOSPITAL_COMMUNITY): Payer: Self-pay | Admitting: Emergency Medicine

## 2019-11-19 DIAGNOSIS — Z87891 Personal history of nicotine dependence: Secondary | ICD-10-CM | POA: Insufficient documentation

## 2019-11-19 DIAGNOSIS — Z79899 Other long term (current) drug therapy: Secondary | ICD-10-CM | POA: Insufficient documentation

## 2019-11-19 DIAGNOSIS — L02411 Cutaneous abscess of right axilla: Secondary | ICD-10-CM | POA: Insufficient documentation

## 2019-11-19 DIAGNOSIS — M79621 Pain in right upper arm: Secondary | ICD-10-CM | POA: Diagnosis present

## 2019-11-19 MED ORDER — LIDOCAINE-EPINEPHRINE (PF) 2 %-1:200000 IJ SOLN
10.0000 mL | Freq: Once | INTRAMUSCULAR | Status: AC
  Administered 2019-11-19: 10 mL
  Filled 2019-11-19: qty 20

## 2019-11-19 NOTE — ED Triage Notes (Signed)
Patient reports skin abscess at right axilla onset last week increasing in size and pain , no drainage /no fever or chills .

## 2019-11-19 NOTE — ED Provider Notes (Signed)
[facility] EMERGENCY DEPARTMENT Provider Note   CSN: [id number] Arrival date & time: 11/19/19  0401     History Chief Complaint  Patient presents with  . Abscess    Christina Cummings is a 24 y.o. female.  The history is provided by the patient.  Abscess Location:  Shoulder/arm Shoulder/arm abscess location:  R axilla Size:  4x5cm Abscess quality: fluctuance, induration and painful   Red streaking: no   Duration:  1 week Progression:  Worsening Pain details:    Quality:  Aching, throbbing, tightness, sharp and shooting   Severity:  Severe   Duration:  1 week   Timing:  Constant   Progression:  Worsening Chronicity:  New Context comment:  Shaves her armpits and has had this before but not this badly Relieved by:  Nothing Worsened by:  Draining/squeezing Ineffective treatments:  Warm compresses Associated symptoms: no fever   Risk factors: prior abscess        Past Medical History:  Diagnosis Date  . Hx of gonorrhea 06/2017  . Seasonal allergies     There are no problems to display for this patient.   History reviewed. No pertinent surgical history.   OB History    Gravida  0   Para  0   Term  0   Preterm  0   AB  0   Living  0     SAB  0   TAB  0   Ectopic  0   Multiple  0   Live Births  0           No family history on file.  Social History   Tobacco Use  . Smoking status: Former Smoker    Packs/day: 0.50    Types: Cigarettes  . Smokeless tobacco: Never Used  Substance Use Topics  . Alcohol use: No  . Drug use: No    Home Medications Prior to Admission medications   Medication Sig Start Date End Date Taking? Authorizing Provider  medroxyPROGESTERone (DEPO-PROVERA) 150 MG/ML injection Inject 150 mg into the muscle every 3 (three) months.   Yes [provider]    Allergies    Patient has no known allergies.  Review of Systems   Review of Systems  Constitutional: Negative for fever.  All  other systems reviewed and are negative.   Physical Exam Updated Vital Signs BP 117/73 (BP Location: Left Arm)   Pulse 85   Temp 99.1 F (37.3 C) (Oral)   Resp 16   LMP 11/12/2019   SpO2 100%   Physical Exam Vitals and nursing note reviewed.  Constitutional:      General: She is not in acute distress.    Appearance: Normal appearance. She is normal weight.  HENT:     Head: Normocephalic.  Cardiovascular:     Rate and Rhythm: Normal rate.  Pulmonary:     Effort: Pulmonary effort is normal. No respiratory distress.  Skin:    General: Skin is warm.       Neurological:     Mental Status: She is alert. Mental status is at baseline.  Psychiatric:        Mood and Affect: Mood normal.        Behavior: Behavior normal.        Thought Content: Thought content normal.     ED Results / Procedures / Treatments   Labs (all labs ordered are listed, but only abnormal results are displayed) Labs Reviewed - No  data to display  EKG None  Radiology No results found.  Procedures Procedures (including critical care time)  INCISION AND DRAINAGE Performed by: Gwyneth Sprout Consent: Verbal consent obtained. Risks and benefits: risks, benefits and alternatives were discussed Type: abscess  Body area: Right axilla  Anesthesia: local infiltration  Incision was made with a scalpel.  Local anesthetic: lidocaine 2% with epinephrine  Anesthetic total: 5 ml  Complexity: complex Blunt dissection to break up loculations  Drainage: purulent  Drainage amount: 5mL  Packing material: 1/4 in iodoform gauze  Patient tolerance: Patient tolerated the procedure well with no immediate complications.     Medications Ordered in ED Medications  lidocaine-EPINEPHrine (XYLOCAINE W/EPI) 2 %-1:200000 (PF) injection 10 mL (10 mLs Infiltration Given 11/19/19 0825)    ED Course  I have reviewed the triage vital signs and the nursing notes.  Pertinent labs & imaging results that  were available during my care of the patient were reviewed by me and considered in my medical decision making (see chart for details).    MDM Rules/Calculators/A&P                          Patient presenting with an uncomplicated abscess of the right axilla.  Drainage as above.  No signs of surrounding cellulitis requiring antibiotics at this time.  Given size of the abscess and significant purulent drainage a small amount of packing was placed to keep the abscess open and draining.  Patient was instructed to remove the packing in 2 days if still present and return for any worsening symptoms.  Final Clinical Impression(s) / ED Diagnoses Final diagnoses:  Abscess of axilla, right    Rx / DC Orders ED Discharge Orders    None       Gwyneth Sprout, MD 11/19/19 [phone]

## 2019-11-19 NOTE — ED Notes (Signed)
Called pt to be roomed no response x 1  

## 2019-11-19 NOTE — Discharge Instructions (Addendum)
Continue to do warm soaks and compresses on the area.  If packing is still present in 2 days just pull it out.  You can take Tylenol 2 tablets and ibuprofen 2 tablets at the same time every 6 hours as needed for pain.

## 2020-03-05 ENCOUNTER — Encounter (HOSPITAL_COMMUNITY): Payer: Self-pay | Admitting: Emergency Medicine

## 2020-03-05 ENCOUNTER — Emergency Department (HOSPITAL_COMMUNITY)
Admission: EM | Admit: 2020-03-05 | Discharge: 2020-03-06 | Disposition: A | Discharge status: Home / Self Care | Payer: Medicaid Other | Attending: Emergency Medicine | Admitting: Emergency Medicine

## 2020-03-05 DIAGNOSIS — Z5321 Procedure and treatment not carried out due to patient leaving prior to being seen by health care provider: Secondary | ICD-10-CM | POA: Diagnosis not present

## 2020-03-05 DIAGNOSIS — H9201 Otalgia, right ear: Secondary | ICD-10-CM | POA: Diagnosis not present

## 2020-03-05 NOTE — ED Triage Notes (Signed)
Pt c/o R ear pain x 4 days, denies fever, denies drainage.

## 2020-03-05 NOTE — ED Notes (Signed)
Pt has left the ED

## 2020-03-06 ENCOUNTER — Ambulatory Visit (HOSPITAL_COMMUNITY)
Admission: EM | Admit: 2020-03-06 | Discharge: 2020-03-06 | Disposition: A | Discharge status: Home / Self Care | Payer: Medicaid Other | Attending: Physician Assistant | Admitting: Physician Assistant

## 2020-03-06 ENCOUNTER — Other Ambulatory Visit: Payer: Self-pay

## 2020-03-06 ENCOUNTER — Encounter (HOSPITAL_COMMUNITY): Payer: Self-pay | Admitting: Physician Assistant

## 2020-03-06 DIAGNOSIS — H9201 Otalgia, right ear: Secondary | ICD-10-CM

## 2020-03-06 DIAGNOSIS — H6501 Acute serous otitis media, right ear: Secondary | ICD-10-CM

## 2020-03-06 MED ORDER — AMOXICILLIN 500 MG PO CAPS: 500.0000 mg | ORAL_CAPSULE | Freq: Two times a day (BID) | ORAL | 0 refills | Status: DC

## 2020-03-06 MED ORDER — IBUPROFEN 800 MG PO TABS: 800.0000 mg | ORAL_TABLET | Freq: Three times a day (TID) | ORAL | 0 refills | Status: DC

## 2020-03-06 NOTE — Discharge Instructions (Signed)
You have a right ear infection. Will prescribe antibiotics. Complete the course. Also try some Mucinex as directed with 4 oz of water. May take the Ibuprofen as needed for pain and pressure. If worsens f/u

## 2020-03-06 NOTE — ED Provider Notes (Signed)
[facility]    CSN: [id number] Arrival date & time: 03/06/20  1009      History   Chief Complaint Chief Complaint  Patient presents with  . Otalgia    HPI Christina Cummings is a 24 y.o. female.   Presents with a 4 day history of right ear pain. No associated fevers, cough or congestion. No drainage. No prior history. Pain radiates into right side of head and into neck.      Past Medical History:  Diagnosis Date  . Hx of gonorrhea 06/2017  . Seasonal allergies     There are no problems to display for this patient.   History reviewed. No pertinent surgical history.  OB History    Gravida  0   Para  0   Term  0   Preterm  0   AB  0   Living  0     SAB  0   TAB  0   Ectopic  0   Multiple  0   Live Births  0            Home Medications    Prior to Admission medications   Medication Sig Start Date End Date Taking? Authorizing Provider  medroxyPROGESTERone (DEPO-PROVERA) 150 MG/ML injection Inject 150 mg into the muscle every 3 (three) months.    [provider]    Family History Family History  Family history unknown: Yes    Social History Social History   Tobacco Use  . Smoking status: Former Smoker    Packs/day: 0.50    Types: Cigarettes  . Smokeless tobacco: Never Used  Substance Use Topics  . Alcohol use: No  . Drug use: No     Allergies   Patient has no known allergies.   Review of Systems Review of Systems  Constitutional: Negative for fever.  HENT: Positive for ear pain. Negative for congestion, rhinorrhea and sinus pressure.   Eyes: Negative.   Respiratory: Negative for cough.   Allergic/Immunologic: Negative for environmental allergies.     Physical Exam Triage Vital Signs ED Triage Vitals  Enc Vitals Group     BP 03/06/20 1040 117/78     Pulse Rate 03/06/20 1040 78     Resp 03/06/20 1040 17     Temp 03/06/20 1040 98.6 F (37 C)     Temp Source 03/06/20 1040 Oral     SpO2 03/06/20  1040 100 %     Weight --      Height --      Head Circumference --      Peak Flow --      Pain Score 03/06/20 1042 4     Pain Loc --      Pain Edu? --      Excl. in GC? --    No data found.  Updated Vital Signs BP 117/78 (BP Location: Left Arm)   Pulse 78   Temp 98.6 F (37 C) (Oral)   Resp 17   LMP 02/18/2020   SpO2 100%   Visual Acuity Right Eye Distance:   Left Eye Distance:   Bilateral Distance:    Right Eye Near:   Left Eye Near:    Bilateral Near:     Physical Exam Vitals and nursing note reviewed.  Constitutional:      General: She is not in acute distress.    Appearance: Normal appearance. She is not ill-appearing.  HENT:     Head: Normocephalic and  atraumatic.     Right Ear: Ear canal normal. There is no impacted cerumen.     Ears:     Comments: Right post TM effusion and buldge Cardiovascular:     Rate and Rhythm: Normal rate and regular rhythm.  Pulmonary:     Effort: Pulmonary effort is normal.     Breath sounds: Normal breath sounds.  Lymphadenopathy:     Cervical: No cervical adenopathy.  Skin:    General: Skin is warm and dry.  Neurological:     Mental Status: She is alert.  Psychiatric:        Mood and Affect: Mood normal.      UC Treatments / Results  Labs (all labs ordered are listed, but only abnormal results are displayed) Labs Reviewed - No data to display  EKG   Radiology No results found.  Procedures Procedures (including critical care time)  Medications Ordered in UC Medications - No data to display  Initial Impression / Assessment and Plan / UC Course  I have reviewed the triage vital signs and the nursing notes.  Pertinent labs & imaging results that were available during my care of the patient were reviewed by me and considered in my medical decision making (see chart for details).     Ear effusion in adult, will cover with antibiotics and NSAIDs. FU if worsens.  Final Clinical Impressions(s) / UC Diagnoses     Final diagnoses:  None   Discharge Instructions   None    ED Prescriptions    None     PDMP not reviewed this encounter.   Riki Sheer, PA-C 03/06/20 1055

## 2020-03-06 NOTE — ED Triage Notes (Signed)
Pt presents with right side ear discomfort X 5 days.

## 2021-10-31 ENCOUNTER — Inpatient Hospital Stay (HOSPITAL_COMMUNITY)
Admission: AD | Admit: 2021-10-31 | Discharge: 2021-10-31 | Disposition: A | Discharge status: Home / Self Care | Payer: Medicaid Other | Attending: Family Medicine | Admitting: Family Medicine

## 2021-10-31 ENCOUNTER — Inpatient Hospital Stay (HOSPITAL_COMMUNITY): Payer: Medicaid Other

## 2021-10-31 ENCOUNTER — Encounter (HOSPITAL_COMMUNITY): Payer: Self-pay | Admitting: *Deleted

## 2021-10-31 DIAGNOSIS — O0931 Supervision of pregnancy with insufficient antenatal care, first trimester: Secondary | ICD-10-CM | POA: Diagnosis not present

## 2021-10-31 DIAGNOSIS — O219 Vomiting of pregnancy, unspecified: Secondary | ICD-10-CM | POA: Diagnosis not present

## 2021-10-31 DIAGNOSIS — Z3A10 10 weeks gestation of pregnancy: Secondary | ICD-10-CM | POA: Insufficient documentation

## 2021-10-31 DIAGNOSIS — O26891 Other specified pregnancy related conditions, first trimester: Secondary | ICD-10-CM | POA: Diagnosis not present

## 2021-10-31 DIAGNOSIS — R102 Pelvic and perineal pain: Secondary | ICD-10-CM | POA: Insufficient documentation

## 2021-10-31 DIAGNOSIS — Z3490 Encounter for supervision of normal pregnancy, unspecified, unspecified trimester: Secondary | ICD-10-CM

## 2021-10-31 HISTORY — DX: Other specified health status: Z78.9

## 2021-10-31 LAB — URINALYSIS, ROUTINE W REFLEX MICROSCOPIC
Bacteria, UA: NONE SEEN
Bilirubin Urine: NEGATIVE
Glucose, UA: NEGATIVE mg/dL
Hgb urine dipstick: NEGATIVE
Ketones, ur: NEGATIVE mg/dL
Nitrite: NEGATIVE
Protein, ur: NEGATIVE mg/dL
Specific Gravity, Urine: 1.012 (ref 1.005–1.030)
pH: 7 (ref 5.0–8.0)

## 2021-10-31 LAB — CBC
HCT: 34.6 % — ABNORMAL LOW (ref 36.0–46.0)
Hemoglobin: 10.7 g/dL — ABNORMAL LOW (ref 12.0–15.0)
MCH: 25.1 pg — ABNORMAL LOW (ref 26.0–34.0)
MCHC: 30.9 g/dL (ref 30.0–36.0)
MCV: 81.2 fL (ref 80.0–100.0)
Platelets: 283 10*3/uL (ref 150–400)
RBC: 4.26 MIL/uL (ref 3.87–5.11)
RDW: 14.6 % (ref 11.5–15.5)
WBC: 7.4 10*3/uL (ref 4.0–10.5)
nRBC: 0 % (ref 0.0–0.2)

## 2021-10-31 LAB — POCT PREGNANCY, URINE: Preg Test, Ur: POSITIVE — AB

## 2021-10-31 LAB — ABO/RH: ABO/RH(D): A POS

## 2021-10-31 LAB — WET PREP, GENITAL
Clue Cells Wet Prep HPF POC: NONE SEEN
Sperm: NONE SEEN
Trich, Wet Prep: NONE SEEN
WBC, Wet Prep HPF POC: <10 (ref <10)
Yeast Wet Prep HPF POC: NONE SEEN

## 2021-10-31 LAB — HCG, QUANTITATIVE, PREGNANCY: hCG, Beta Chain, Quant, S: 234372 m[IU]/mL — ABNORMAL HIGH (ref <5)

## 2021-10-31 MED ORDER — PROMETHAZINE HCL 25 MG PO TABS: 12.5000 mg | ORAL_TABLET | Freq: Four times a day (QID) | ORAL | 0 refills | Status: DC | PRN

## 2021-10-31 NOTE — MAU Provider Note (Addendum)
History     CSN: [id number]  Arrival date and time: 10/31/21 1105   Event Date/Time   First Provider Initiated Contact with Patient 10/31/21 1309      Chief Complaint  Patient presents with   Abdominal Pain   Headache   Possible Pregnancy   Christina Cummings is a 26 y.o. G1P0000 at [redacted]w[redacted]d who presents today with cramping. She denies bleeding. She has also had nausea every day, but today she also had vomiting. She denies any bleeding. She has not started prenatal care at this time.   Pelvic Pain The patient's primary symptoms include pelvic pain. This is a new problem. The current episode started in the past 7 days. The problem occurs intermittently. The problem has been unchanged. The problem affects both sides. She is pregnant. Associated symptoms include headaches, nausea and vomiting. The vaginal discharge was normal. There has been no bleeding. Nothing aggravates the symptoms. She has tried acetaminophen for the symptoms. The treatment provided mild relief.   OB History     Gravida  1   Para  0   Term  0   Preterm  0   AB  0   Living  0      SAB  0   IAB  0   Ectopic  0   Multiple  0   Live Births  0           Past Medical History:  Diagnosis Date   Hx of gonorrhea 06/2017   Medical history non-contributory    Seasonal allergies     Past Surgical History:  Procedure Laterality Date   NO PAST SURGERIES      Family History  Problem Relation Age of Onset   Sleep apnea Father     Social History   Tobacco Use   Smoking status: Former    Packs/day: 0.50    Types: Cigarettes   Smokeless tobacco: Never  Vaping Use   Vaping Use: Never used  Substance Use Topics   Alcohol use: No   Drug use: No    Allergies: No Known Allergies  Medications Prior to Admission  Medication Sig Dispense Refill Last Dose   amoxicillin (AMOXIL) 500 MG capsule Take 1 capsule (500 mg total) by mouth 2 (two) times daily. 20 capsule 0    ibuprofen (ADVIL) 800 MG  tablet Take 1 tablet (800 mg total) by mouth 3 (three) times daily. 21 tablet 0    medroxyPROGESTERone (DEPO-PROVERA) 150 MG/ML injection Inject 150 mg into the muscle every 3 (three) months.       Review of Systems  Gastrointestinal:  Positive for nausea and vomiting.  Genitourinary:  Positive for pelvic pain.  Neurological:  Positive for headaches.  All other systems reviewed and are negative. Physical Exam   Blood pressure 127/70, pulse 91, temperature 98.4 F (36.9 C), temperature source Oral, resp. rate 19, height 5\' 8"  (1.727 m), weight 88.1 kg, last menstrual period 08/21/2021, SpO2 100 %.  Physical Exam Constitutional:      Appearance: She is well-developed.  HENT:     Head: Normocephalic.  Eyes:     Pupils: Pupils are equal, round, and reactive to light.  Cardiovascular:     Rate and Rhythm: Normal rate and regular rhythm.     Heart sounds: Normal heart sounds.  Pulmonary:     Effort: Pulmonary effort is normal. No respiratory distress.     Breath sounds: Normal breath sounds.  Abdominal:     Palpations:  Abdomen is soft.     Tenderness: There is no abdominal tenderness.  Genitourinary:    Vagina: No bleeding. Vaginal discharge: mucusy.    Comments: External: no lesion Vagina: small amount of white discharge     Musculoskeletal:        General: Normal range of motion.     Cervical back: Normal range of motion and neck supple.  Skin:    General: Skin is warm and dry.  Neurological:     Mental Status: She is alert and oriented to person, place, and time.  Psychiatric:        Mood and Affect: Mood normal.        Behavior: Behavior normal.   Results for orders placed or performed during the hospital encounter of 10/31/21 (from the past 24 hour(s))  Pregnancy, urine POC     Status: Abnormal   Collection Time: 10/31/21 11:38 AM  Result Value Ref Range   Preg Test, Ur POSITIVE (A) NEGATIVE  Urinalysis, Routine w reflex microscopic Urine, Clean Catch     Status:  Abnormal   Collection Time: 10/31/21 11:40 AM  Result Value Ref Range   Color, Urine YELLOW YELLOW   APPearance CLEAR CLEAR   Specific Gravity, Urine 1.012 1.005 - 1.030   pH 7.0 5.0 - 8.0   Glucose, UA NEGATIVE NEGATIVE mg/dL   Hgb urine dipstick NEGATIVE NEGATIVE   Bilirubin Urine NEGATIVE NEGATIVE   Ketones, ur NEGATIVE NEGATIVE mg/dL   Protein, ur NEGATIVE NEGATIVE mg/dL   Nitrite NEGATIVE NEGATIVE   Leukocytes,Ua MODERATE (A) NEGATIVE   RBC / HPF 0-5 0 - 5 RBC/hpf   WBC, UA 6-10 0 - 5 WBC/hpf   Bacteria, UA NONE SEEN NONE SEEN   Squamous Epithelial / LPF 0-5 0 - 5   Mucus PRESENT   CBC     Status: Abnormal   Collection Time: 10/31/21 12:07 PM  Result Value Ref Range   WBC 7.4 4.0 - 10.5 K/uL   RBC 4.26 3.87 - 5.11 MIL/uL   Hemoglobin 10.7 (L) 12.0 - 15.0 g/dL   HCT 16.134.6 (L) 09.636.0 - 04.546.0 %   MCV 81.2 80.0 - 100.0 fL   MCH 25.1 (L) 26.0 - 34.0 pg   MCHC 30.9 30.0 - 36.0 g/dL   RDW 40.914.6 81.111.5 - 91.415.5 %   Platelets 283 150 - 400 K/uL   nRBC 0.0 0.0 - 0.2 %  hCG, quantitative, pregnancy     Status: Abnormal   Collection Time: 10/31/21 12:07 PM  Result Value Ref Range   hCG, Beta Chain, Quant, S 234,372 (H) <5 mIU/mL  ABO/Rh     Status: None   Collection Time: 10/31/21 12:07 PM  Result Value Ref Range   ABO/RH(D) A POS    No rh immune globuloin      NOT A RH IMMUNE GLOBULIN CANDIDATE, PT RH POSITIVE Performed at [facility], [address] [place], [place] [address]   Wet prep, genital     Status: None   Collection Time: 10/31/21  1:21 PM   Specimen: PATH Cytology Cervicovaginal Ancillary Only  Result Value Ref Range   Yeast Wet Prep HPF POC NONE SEEN NONE SEEN   Trich, Wet Prep NONE SEEN NONE SEEN   Clue Cells Wet Prep HPF POC NONE SEEN NONE SEEN   WBC, Wet Prep HPF POC <10 <10   Sperm NONE SEEN    US OB Comp Less 14 Wks  Result Date: 10/31/2021 CLINICAL DATA:  Pelvic pain. Pregnant patient, 10 weeks and 1 day based on the last menstrual period. EXAM:  OBSTETRIC <14 WK ULTRASOUND TECHNIQUE: Transabdominal ultrasound was performed for evaluation of the gestation as well as the maternal uterus and adnexal regions. COMPARISON:  None Available. FINDINGS: Intrauterine gestational sac: Single Yolk sac:  Visualized. Embryo:  Visualized. Cardiac Activity: Visualized. Heart Rate: 167 bpm CRL:   28.4 mm   9 w 4 d                  [place] EDC: 06/01/2022 Subchorionic hemorrhage:  None visualized. Maternal uterus/adnexae: No uterine masses. Unremarkable cervix. Normal ovaries and adnexa. No pelvic free fluid. IMPRESSION: 1. Single live intrauterine pregnancy with a measured gestational age of [redacted] weeks and 4 days. 2. No pregnancy complication. Electronically Signed   By: Amie Portland M.D.   On: 10/31/2021 14:19    MAU Course  Procedures  MDM   Assessment and Plan   1. Pelvic pain in pregnancy, antepartum, first trimester   2. Intrauterine pregnancy   3. [redacted] weeks gestation of pregnancy   4. Nausea/vomiting in pregnancy    DC home in stable condition  1st Trimester precautions  Bleeding precautions RX: phenergan PRN #30 0RF  Return to MAU as needed FU with OB as planned   Follow-up Information     Department, [facility] Follow up.   Contact information: [address] [place] [address] [phone]                Thressa Sheller DNP, CNM  10/31/21  3:26 PM

## 2021-10-31 NOTE — MAU Note (Signed)
Christina Cummings is a 26 y.o. at Unknown here in MAU reporting: been cramping real bad.  This morning when she got up, she started throwing up. Got light headed,no longer feeling that- but her head is hurting. No bleeding LMP: 3/19 Onset of complaint: 2 days ago Pain score: 8/8 Vitals:   10/31/21 1148  BP: 114/65  Pulse: 83  Resp: 17  Temp: 98.4 F (36.9 C)  SpO2: 100%     [id number] to hear in triage Lab orders placed from triage:

## 2021-11-01 LAB — GC/CHLAMYDIA PROBE AMP (~~LOC~~) NOT AT ARMC
Chlamydia: NEGATIVE
Comment: NEGATIVE
Comment: NORMAL
Neisseria Gonorrhea: NEGATIVE

## 2022-10-24 LAB — HEPATITIS C ANTIBODY: HCV Ab: NEGATIVE

## 2022-10-24 LAB — OB RESULTS CONSOLE GC/CHLAMYDIA
Chlamydia: NEGATIVE
Neisseria Gonorrhea: NEGATIVE

## 2022-10-24 LAB — OB RESULTS CONSOLE HEPATITIS B SURFACE ANTIGEN: Hepatitis B Surface Ag: NEGATIVE

## 2022-10-24 LAB — OB RESULTS CONSOLE RUBELLA ANTIBODY, IGM: Rubella: IMMUNE

## 2023-01-18 LAB — OB RESULTS CONSOLE HIV ANTIBODY (ROUTINE TESTING): HIV: NONREACTIVE

## 2023-01-21 LAB — OB RESULTS CONSOLE RPR: RPR: NONREACTIVE

## 2023-03-30 ENCOUNTER — Inpatient Hospital Stay (HOSPITAL_COMMUNITY)
Admission: AD | Admit: 2023-03-30 | Discharge: 2023-04-03 | DRG: 786 | Disposition: A | Discharge status: Home / Self Care | Payer: Medicaid Other | Attending: Obstetrics and Gynecology | Admitting: Obstetrics and Gynecology

## 2023-03-30 ENCOUNTER — Encounter (HOSPITAL_COMMUNITY): Payer: Self-pay | Admitting: Obstetrics and Gynecology

## 2023-03-30 ENCOUNTER — Other Ambulatory Visit: Payer: Self-pay

## 2023-03-30 DIAGNOSIS — Z5986 Financial insecurity: Secondary | ICD-10-CM

## 2023-03-30 DIAGNOSIS — O9902 Anemia complicating childbirth: Secondary | ICD-10-CM | POA: Diagnosis present

## 2023-03-30 DIAGNOSIS — O42913 Preterm premature rupture of membranes, unspecified as to length of time between rupture and onset of labor, third trimester: Principal | ICD-10-CM | POA: Diagnosis present

## 2023-03-30 DIAGNOSIS — O42919 Preterm premature rupture of membranes, unspecified as to length of time between rupture and onset of labor, unspecified trimester: Secondary | ICD-10-CM | POA: Diagnosis present

## 2023-03-30 DIAGNOSIS — Z3A36 36 weeks gestation of pregnancy: Secondary | ICD-10-CM

## 2023-03-30 DIAGNOSIS — Z98891 History of uterine scar from previous surgery: Secondary | ICD-10-CM

## 2023-03-30 DIAGNOSIS — O321XX0 Maternal care for breech presentation, not applicable or unspecified: Secondary | ICD-10-CM | POA: Diagnosis present

## 2023-03-30 DIAGNOSIS — Z836 Family history of other diseases of the respiratory system: Secondary | ICD-10-CM

## 2023-03-30 DIAGNOSIS — O42013 Preterm premature rupture of membranes, onset of labor within 24 hours of rupture, third trimester: Principal | ICD-10-CM

## 2023-03-30 DIAGNOSIS — Z87891 Personal history of nicotine dependence: Secondary | ICD-10-CM

## 2023-03-30 MED ORDER — LACTATED RINGERS IV SOLN: INTRAVENOUS | Status: DC

## 2023-03-30 NOTE — MAU Note (Signed)
.  Christina Cummings is a 27 y.o. at [redacted]w[redacted]d here in MAU reporting: SROM around 2230 - states fluid is yellow. Denies VB or pain. +FM   Onset of complaint: 2230 Pain score: 0 Vitals:   03/30/23 2258  BP: 121/78  Pulse: (!) 124  Resp: 17  Temp: 98.5 F (36.9 C)  SpO2: 99%     FHT:135 Lab orders placed from triage:  fern

## 2023-03-31 ENCOUNTER — Encounter (HOSPITAL_COMMUNITY): Payer: Self-pay

## 2023-03-31 ENCOUNTER — Inpatient Hospital Stay (HOSPITAL_COMMUNITY): Payer: Medicaid Other

## 2023-03-31 ENCOUNTER — Encounter (HOSPITAL_COMMUNITY): Payer: Self-pay | Admitting: Obstetrics and Gynecology

## 2023-03-31 ENCOUNTER — Inpatient Hospital Stay (HOSPITAL_COMMUNITY): Payer: Medicaid Other | Admitting: Certified Registered"

## 2023-03-31 ENCOUNTER — Other Ambulatory Visit: Payer: Self-pay

## 2023-03-31 ENCOUNTER — Encounter (HOSPITAL_COMMUNITY)
Admission: AD | Disposition: A | Discharge status: Home / Self Care | Payer: Self-pay | Source: Home / Self Care | Attending: Obstetrics and Gynecology

## 2023-03-31 ENCOUNTER — Inpatient Hospital Stay (HOSPITAL_COMMUNITY): Payer: Medicaid Other | Admitting: Anesthesiology

## 2023-03-31 DIAGNOSIS — Z836 Family history of other diseases of the respiratory system: Secondary | ICD-10-CM | POA: Diagnosis not present

## 2023-03-31 DIAGNOSIS — O42919 Preterm premature rupture of membranes, unspecified as to length of time between rupture and onset of labor, unspecified trimester: Secondary | ICD-10-CM | POA: Diagnosis present

## 2023-03-31 DIAGNOSIS — Z98891 History of uterine scar from previous surgery: Secondary | ICD-10-CM

## 2023-03-31 DIAGNOSIS — Z3A36 36 weeks gestation of pregnancy: Secondary | ICD-10-CM

## 2023-03-31 DIAGNOSIS — O4202 Full-term premature rupture of membranes, onset of labor within 24 hours of rupture: Secondary | ICD-10-CM | POA: Diagnosis not present

## 2023-03-31 DIAGNOSIS — O321XX0 Maternal care for breech presentation, not applicable or unspecified: Secondary | ICD-10-CM | POA: Diagnosis present

## 2023-03-31 DIAGNOSIS — O42913 Preterm premature rupture of membranes, unspecified as to length of time between rupture and onset of labor, third trimester: Secondary | ICD-10-CM | POA: Diagnosis present

## 2023-03-31 DIAGNOSIS — O9902 Anemia complicating childbirth: Secondary | ICD-10-CM | POA: Diagnosis present

## 2023-03-31 DIAGNOSIS — Z5986 Financial insecurity: Secondary | ICD-10-CM | POA: Diagnosis not present

## 2023-03-31 DIAGNOSIS — Z87891 Personal history of nicotine dependence: Secondary | ICD-10-CM | POA: Diagnosis not present

## 2023-03-31 LAB — TYPE AND SCREEN
ABO/RH(D): A POS
Antibody Screen: NEGATIVE

## 2023-03-31 LAB — CBC
HCT: 30.4 % — ABNORMAL LOW (ref 36.0–46.0)
Hemoglobin: 9 g/dL — ABNORMAL LOW (ref 12.0–15.0)
MCH: 21.7 pg — ABNORMAL LOW (ref 26.0–34.0)
MCHC: 29.6 g/dL — ABNORMAL LOW (ref 30.0–36.0)
MCV: 73.3 fL — ABNORMAL LOW (ref 80.0–100.0)
Platelets: 212 10*3/uL (ref 150–400)
RBC: 4.15 MIL/uL (ref 3.87–5.11)
RDW: 18.6 % — ABNORMAL HIGH (ref 11.5–15.5)
WBC: 7.3 10*3/uL (ref 4.0–10.5)
nRBC: 0.8 % — ABNORMAL HIGH (ref 0.0–0.2)

## 2023-03-31 LAB — POCT FERN TEST: POCT Fern Test: POSITIVE

## 2023-03-31 LAB — RPR: RPR Ser Ql: NONREACTIVE

## 2023-03-31 SURGERY — Surgical Case
Anesthesia: Regional

## 2023-03-31 SURGERY — Surgical Case
Anesthesia: Epidural

## 2023-03-31 MED ORDER — FENTANYL-BUPIVACAINE-NACL 0.5-0.125-0.9 MG/250ML-% EP SOLN
EPIDURAL | Status: AC
  Filled 2023-03-31: qty 250

## 2023-03-31 MED ORDER — GABAPENTIN 100 MG PO CAPS
300.0000 mg | ORAL_CAPSULE | Freq: Three times a day (TID) | ORAL | Status: DC
  Administered 2023-03-31 – 2023-04-03 (×9): 300 mg via ORAL
  Filled 2023-03-31 (×9): qty 3

## 2023-03-31 MED ORDER — FENTANYL CITRATE (PF) 100 MCG/2ML IJ SOLN: 50.0000 ug | INTRAMUSCULAR | Status: DC | PRN

## 2023-03-31 MED ORDER — KETOROLAC TROMETHAMINE 30 MG/ML IJ SOLN
30.0000 mg | Freq: Four times a day (QID) | INTRAMUSCULAR | Status: AC
  Administered 2023-03-31 – 2023-04-01 (×4): 30 mg via INTRAVENOUS
  Filled 2023-03-31 (×4): qty 1

## 2023-03-31 MED ORDER — ACETAMINOPHEN 10 MG/ML IV SOLN: 1000.0000 mg | Freq: Once | INTRAVENOUS | Status: DC | PRN

## 2023-03-31 MED ORDER — PHENYLEPHRINE 80 MCG/ML (10ML) SYRINGE FOR IV PUSH (FOR BLOOD PRESSURE SUPPORT)
PREFILLED_SYRINGE | INTRAVENOUS | Status: AC
  Filled 2023-03-31: qty 10

## 2023-03-31 MED ORDER — KETOROLAC TROMETHAMINE 30 MG/ML IJ SOLN
INTRAMUSCULAR | Status: AC
  Filled 2023-03-31: qty 1

## 2023-03-31 MED ORDER — PENICILLIN G POT IN DEXTROSE 60000 UNIT/ML IV SOLN: 3.0000 10*6.[IU] | INTRAVENOUS | Status: DC

## 2023-03-31 MED ORDER — SIMETHICONE 80 MG PO CHEW
80.0000 mg | CHEWABLE_TABLET | Freq: Three times a day (TID) | ORAL | Status: DC
  Administered 2023-03-31 – 2023-04-03 (×9): 80 mg via ORAL
  Filled 2023-03-31 (×9): qty 1

## 2023-03-31 MED ORDER — SODIUM CHLORIDE 0.9 % IV SOLN: 500.0000 mg | Freq: Once | INTRAVENOUS | Status: DC

## 2023-03-31 MED ORDER — CEFAZOLIN SODIUM-DEXTROSE 2-4 GM/100ML-% IV SOLN
INTRAVENOUS | Status: AC
  Filled 2023-03-31: qty 100

## 2023-03-31 MED ORDER — OXYTOCIN-SODIUM CHLORIDE 30-0.9 UT/500ML-% IV SOLN
INTRAVENOUS | Status: AC
  Filled 2023-03-31: qty 500

## 2023-03-31 MED ORDER — MORPHINE SULFATE (PF) 0.5 MG/ML IJ SOLN
INTRAMUSCULAR | Status: DC | PRN
  Administered 2023-03-31: 3 mg via EPIDURAL

## 2023-03-31 MED ORDER — TETANUS-DIPHTH-ACELL PERTUSSIS 5-2.5-18.5 LF-MCG/0.5 IM SUSY: 0.5000 mL | PREFILLED_SYRINGE | Freq: Once | INTRAMUSCULAR | Status: DC

## 2023-03-31 MED ORDER — PHENYLEPHRINE 80 MCG/ML (10ML) SYRINGE FOR IV PUSH (FOR BLOOD PRESSURE SUPPORT)
80.0000 ug | PREFILLED_SYRINGE | Freq: Once | INTRAVENOUS | Status: AC
  Administered 2023-03-31: 80 ug via INTRAVENOUS

## 2023-03-31 MED ORDER — ENOXAPARIN SODIUM 60 MG/0.6ML IJ SOSY
60.0000 mg | PREFILLED_SYRINGE | INTRAMUSCULAR | Status: DC
  Administered 2023-04-01 – 2023-04-03 (×3): 60 mg via SUBCUTANEOUS
  Filled 2023-03-31 (×3): qty 0.6

## 2023-03-31 MED ORDER — CEFAZOLIN SODIUM-DEXTROSE 2-4 GM/100ML-% IV SOLN
2.0000 g | Freq: Once | INTRAVENOUS | Status: AC
  Administered 2023-03-31: 2 g via INTRAVENOUS

## 2023-03-31 MED ORDER — HYDROXYZINE HCL 50 MG PO TABS: 50.0000 mg | ORAL_TABLET | Freq: Four times a day (QID) | ORAL | Status: DC | PRN

## 2023-03-31 MED ORDER — OXYTOCIN-SODIUM CHLORIDE 30-0.9 UT/500ML-% IV SOLN
INTRAVENOUS | Status: DC | PRN
  Administered 2023-03-31: 150 mL/h via INTRAVENOUS

## 2023-03-31 MED ORDER — SCOPOLAMINE 1 MG/3DAYS TD PT72: 1.0000 | MEDICATED_PATCH | Freq: Once | TRANSDERMAL | Status: DC

## 2023-03-31 MED ORDER — OXYTOCIN-SODIUM CHLORIDE 30-0.9 UT/500ML-% IV SOLN: 2.5000 [IU]/h | INTRAVENOUS | Status: DC

## 2023-03-31 MED ORDER — COCONUT OIL OIL: 1.0000 | TOPICAL_OIL | Status: DC | PRN

## 2023-03-31 MED ORDER — OXYTOCIN BOLUS FROM INFUSION: 333.0000 mL | Freq: Once | INTRAVENOUS | Status: DC

## 2023-03-31 MED ORDER — ZOLPIDEM TARTRATE 5 MG PO TABS: 5.0000 mg | ORAL_TABLET | Freq: Every evening | ORAL | Status: DC | PRN

## 2023-03-31 MED ORDER — FENTANYL CITRATE (PF) 100 MCG/2ML IJ SOLN
INTRAMUSCULAR | Status: AC
  Filled 2023-03-31: qty 2

## 2023-03-31 MED ORDER — SENNOSIDES-DOCUSATE SODIUM 8.6-50 MG PO TABS
2.0000 | ORAL_TABLET | Freq: Every day | ORAL | Status: DC
  Administered 2023-04-01 – 2023-04-03 (×3): 2 via ORAL
  Filled 2023-03-31 (×3): qty 2

## 2023-03-31 MED ORDER — ACETAMINOPHEN 325 MG PO TABS: 650.0000 mg | ORAL_TABLET | ORAL | Status: DC | PRN

## 2023-03-31 MED ORDER — FENTANYL CITRATE (PF) 100 MCG/2ML IJ SOLN
INTRAMUSCULAR | Status: DC | PRN
Start: 2023-03-31 — End: 2023-03-31
  Administered 2023-03-31: 15 ug via INTRATHECAL

## 2023-03-31 MED ORDER — DIBUCAINE (PERIANAL) 1 % EX OINT: 1.0000 | TOPICAL_OINTMENT | CUTANEOUS | Status: DC | PRN

## 2023-03-31 MED ORDER — KETOROLAC TROMETHAMINE 30 MG/ML IJ SOLN
30.0000 mg | Freq: Four times a day (QID) | INTRAMUSCULAR | Status: DC | PRN
Start: 2023-03-31 — End: 2023-03-31

## 2023-03-31 MED ORDER — MENTHOL 3 MG MT LOZG: 1.0000 | LOZENGE | OROMUCOSAL | Status: DC | PRN

## 2023-03-31 MED ORDER — NALOXONE HCL 4 MG/10ML IJ SOLN: 1.0000 ug/kg/h | INTRAVENOUS | Status: DC | PRN

## 2023-03-31 MED ORDER — PRENATAL MULTIVITAMIN CH
1.0000 | ORAL_TABLET | Freq: Every day | ORAL | Status: DC
  Administered 2023-03-31 – 2023-04-02 (×3): 1 via ORAL
  Filled 2023-03-31 (×3): qty 1

## 2023-03-31 MED ORDER — DEXAMETHASONE SODIUM PHOSPHATE 10 MG/ML IJ SOLN
INTRAMUSCULAR | Status: DC | PRN
  Administered 2023-03-31: 4 mg via INTRAVENOUS

## 2023-03-31 MED ORDER — IBUPROFEN 600 MG PO TABS
600.0000 mg | ORAL_TABLET | Freq: Four times a day (QID) | ORAL | Status: DC
  Administered 2023-04-01 – 2023-04-03 (×7): 600 mg via ORAL
  Filled 2023-03-31 (×7): qty 1

## 2023-03-31 MED ORDER — FENTANYL CITRATE (PF) 100 MCG/2ML IJ SOLN
25.0000 ug | INTRAMUSCULAR | Status: DC | PRN
  Administered 2023-03-31: 25 ug via INTRAVENOUS

## 2023-03-31 MED ORDER — LACTATED RINGERS IV SOLN: 500.0000 mL | INTRAVENOUS | Status: DC | PRN

## 2023-03-31 MED ORDER — OXYTOCIN-SODIUM CHLORIDE 30-0.9 UT/500ML-% IV SOLN: 2.5000 [IU]/h | INTRAVENOUS | Status: AC

## 2023-03-31 MED ORDER — DIPHENHYDRAMINE HCL 25 MG PO CAPS: 25.0000 mg | ORAL_CAPSULE | ORAL | Status: DC | PRN

## 2023-03-31 MED ORDER — SODIUM BICARBONATE 8.4 % IV SOLN
INTRAVENOUS | Status: DC | PRN
  Administered 2023-03-31: 3 mL via EPIDURAL
  Administered 2023-03-31: 5 mL via EPIDURAL
  Administered 2023-03-31: 10 mL via EPIDURAL
  Administered 2023-03-31: 2 mL via EPIDURAL

## 2023-03-31 MED ORDER — SIMETHICONE 80 MG PO CHEW: 80.0000 mg | CHEWABLE_TABLET | ORAL | Status: DC | PRN

## 2023-03-31 MED ORDER — LACTATED RINGERS IV SOLN: INTRAVENOUS | Status: DC

## 2023-03-31 MED ORDER — NALOXONE HCL 0.4 MG/ML IJ SOLN: 0.4000 mg | INTRAMUSCULAR | Status: DC | PRN

## 2023-03-31 MED ORDER — METOCLOPRAMIDE HCL 5 MG/ML IJ SOLN
INTRAMUSCULAR | Status: DC | PRN
  Administered 2023-03-31: 10 mg via INTRAVENOUS

## 2023-03-31 MED ORDER — SOD CITRATE-CITRIC ACID 500-334 MG/5ML PO SOLN: 30.0000 mL | ORAL | Status: DC | PRN

## 2023-03-31 MED ORDER — LIDOCAINE HCL (PF) 1 % IJ SOLN: 30.0000 mL | INTRAMUSCULAR | Status: DC | PRN

## 2023-03-31 MED ORDER — SOD CITRATE-CITRIC ACID 500-334 MG/5ML PO SOLN: 30.0000 mL | ORAL | Status: DC

## 2023-03-31 MED ORDER — ONDANSETRON HCL 4 MG/2ML IJ SOLN: 4.0000 mg | Freq: Four times a day (QID) | INTRAMUSCULAR | Status: DC | PRN

## 2023-03-31 MED ORDER — METOCLOPRAMIDE HCL 5 MG/ML IJ SOLN
INTRAMUSCULAR | Status: AC
  Filled 2023-03-31: qty 2

## 2023-03-31 MED ORDER — METHYLERGONOVINE MALEATE 0.2 MG PO TABS: 0.2000 mg | ORAL_TABLET | ORAL | Status: DC | PRN

## 2023-03-31 MED ORDER — ONDANSETRON HCL 4 MG/2ML IJ SOLN
INTRAMUSCULAR | Status: DC | PRN
  Administered 2023-03-31: 4 mg via INTRAVENOUS

## 2023-03-31 MED ORDER — DIPHENHYDRAMINE HCL 50 MG/ML IJ SOLN: 12.5000 mg | INTRAMUSCULAR | Status: DC | PRN

## 2023-03-31 MED ORDER — SODIUM CHLORIDE 0.9% FLUSH: 3.0000 mL | INTRAVENOUS | Status: DC | PRN

## 2023-03-31 MED ORDER — MORPHINE SULFATE (PF) 0.5 MG/ML IJ SOLN
INTRAMUSCULAR | Status: AC
  Filled 2023-03-31: qty 10

## 2023-03-31 MED ORDER — MEASLES, MUMPS & RUBELLA VAC IJ SOLR: 0.5000 mL | Freq: Once | INTRAMUSCULAR | Status: DC

## 2023-03-31 MED ORDER — TRANEXAMIC ACID-NACL 1000-0.7 MG/100ML-% IV SOLN
INTRAVENOUS | Status: AC
  Filled 2023-03-31: qty 100

## 2023-03-31 MED ORDER — PHENYLEPHRINE 80 MCG/ML (10ML) SYRINGE FOR IV PUSH (FOR BLOOD PRESSURE SUPPORT)
240.0000 ug | PREFILLED_SYRINGE | Freq: Once | INTRAVENOUS | Status: AC
  Administered 2023-03-31: 240 ug via INTRAVENOUS

## 2023-03-31 MED ORDER — POVIDONE-IODINE 10 % EX SWAB: 2.0000 | Freq: Once | CUTANEOUS | Status: DC

## 2023-03-31 MED ORDER — OXYCODONE-ACETAMINOPHEN 5-325 MG PO TABS: 1.0000 | ORAL_TABLET | ORAL | Status: DC | PRN

## 2023-03-31 MED ORDER — LACTATED RINGERS IV BOLUS
1000.0000 mL | Freq: Once | INTRAVENOUS | Status: AC
  Administered 2023-03-30: 1000 mL via INTRAVENOUS

## 2023-03-31 MED ORDER — TERBUTALINE SULFATE 1 MG/ML IJ SOLN
0.2500 mg | Freq: Once | INTRAMUSCULAR | Status: AC
  Administered 2023-03-31: 0.25 mg via SUBCUTANEOUS

## 2023-03-31 MED ORDER — PHENYLEPHRINE 80 MCG/ML (10ML) SYRINGE FOR IV PUSH (FOR BLOOD PRESSURE SUPPORT)
PREFILLED_SYRINGE | INTRAVENOUS | Status: DC | PRN
  Administered 2023-03-31: 80 ug via INTRAVENOUS
  Administered 2023-03-31: 120 ug via INTRAVENOUS

## 2023-03-31 MED ORDER — OXYCODONE-ACETAMINOPHEN 5-325 MG PO TABS
1.0000 | ORAL_TABLET | ORAL | Status: DC | PRN
  Administered 2023-04-02: 1 via ORAL
  Filled 2023-03-31: qty 1

## 2023-03-31 MED ORDER — EPHEDRINE 5 MG/ML INJ
INTRAVENOUS | Status: AC
  Filled 2023-03-31: qty 5

## 2023-03-31 MED ORDER — FENTANYL CITRATE (PF) 100 MCG/2ML IJ SOLN
INTRAMUSCULAR | Status: DC | PRN
  Administered 2023-03-31: 100 ug via EPIDURAL

## 2023-03-31 MED ORDER — TERBUTALINE SULFATE 1 MG/ML IJ SOLN
INTRAMUSCULAR | Status: AC
  Filled 2023-03-31: qty 1

## 2023-03-31 MED ORDER — WITCH HAZEL-GLYCERIN EX PADS: 1.0000 | MEDICATED_PAD | CUTANEOUS | Status: DC | PRN

## 2023-03-31 MED ORDER — METHYLERGONOVINE MALEATE 0.2 MG/ML IJ SOLN: 0.2000 mg | INTRAMUSCULAR | Status: DC | PRN

## 2023-03-31 MED ORDER — SODIUM CHLORIDE 0.9 % IV SOLN
INTRAVENOUS | Status: AC
  Filled 2023-03-31: qty 5

## 2023-03-31 MED ORDER — KETOROLAC TROMETHAMINE 30 MG/ML IJ SOLN
30.0000 mg | Freq: Four times a day (QID) | INTRAMUSCULAR | Status: DC | PRN
  Administered 2023-03-31: 30 mg via INTRAVENOUS

## 2023-03-31 MED ORDER — ONDANSETRON HCL 4 MG/2ML IJ SOLN: 4.0000 mg | Freq: Three times a day (TID) | INTRAMUSCULAR | Status: DC | PRN

## 2023-03-31 MED ORDER — ACETAMINOPHEN 500 MG PO TABS
1000.0000 mg | ORAL_TABLET | Freq: Four times a day (QID) | ORAL | Status: AC
  Administered 2023-03-31 – 2023-04-01 (×4): 1000 mg via ORAL
  Filled 2023-03-31 (×4): qty 2

## 2023-03-31 MED ORDER — BUPIVACAINE HCL (PF) 0.25 % IJ SOLN
INTRAMUSCULAR | Status: DC | PRN
Start: 2023-03-31 — End: 2023-03-31
  Administered 2023-03-31: 1 mL via INTRATHECAL

## 2023-03-31 MED ORDER — OXYCODONE-ACETAMINOPHEN 5-325 MG PO TABS: 2.0000 | ORAL_TABLET | ORAL | Status: DC | PRN

## 2023-03-31 MED ORDER — SODIUM CHLORIDE 0.9 % IV SOLN
5.0000 10*6.[IU] | Freq: Once | INTRAVENOUS | Status: AC
  Administered 2023-03-31: 5 10*6.[IU] via INTRAVENOUS
  Filled 2023-03-31: qty 5

## 2023-03-31 MED ORDER — DIPHENHYDRAMINE HCL 25 MG PO CAPS
25.0000 mg | ORAL_CAPSULE | Freq: Four times a day (QID) | ORAL | Status: DC | PRN
  Administered 2023-03-31 (×2): 25 mg via ORAL
  Filled 2023-03-31 (×2): qty 1

## 2023-03-31 MED ORDER — ACETAMINOPHEN 10 MG/ML IV SOLN
INTRAVENOUS | Status: AC
  Filled 2023-03-31: qty 100

## 2023-03-31 SURGICAL SUPPLY — 37 items
ADH SKN CLS APL DERMABOND .7 (GAUZE/BANDAGES/DRESSINGS) ×2
APL PRP STRL LF DISP 70% ISPRP (MISCELLANEOUS) ×2
APL SKNCLS STERI-STRIP NONHPOA (GAUZE/BANDAGES/DRESSINGS) ×1
BENZOIN TINCTURE PRP APPL 2/3 (GAUZE/BANDAGES/DRESSINGS) IMPLANT
CHLORAPREP W/TINT 26 (MISCELLANEOUS) ×2 IMPLANT
CLAMP UMBILICAL CORD (MISCELLANEOUS) ×1 IMPLANT
CLOTH BEACON ORANGE TIMEOUT ST (SAFETY) ×1 IMPLANT
DERMABOND ADVANCED .7 DNX12 (GAUZE/BANDAGES/DRESSINGS) ×2 IMPLANT
DRSG OPSITE POSTOP 4X10 (GAUZE/BANDAGES/DRESSINGS) ×1 IMPLANT
ELECT REM PT RETURN 9FT ADLT (ELECTROSURGICAL) ×1
ELECTRODE REM PT RTRN 9FT ADLT (ELECTROSURGICAL) ×1 IMPLANT
EXTRACTOR VACUUM KIWI (MISCELLANEOUS) ×1 IMPLANT
GAUZE SPONGE 4X4 12PLY STRL LF (GAUZE/BANDAGES/DRESSINGS) IMPLANT
GLOVE BIOGEL PI IND STRL 7.0 (GLOVE) ×2 IMPLANT
GLOVE BIOGEL PI IND STRL 7.5 (GLOVE) ×2 IMPLANT
GLOVE ECLIPSE 7.5 STRL STRAW (GLOVE) ×1 IMPLANT
GOWN STRL REUS W/TWL LRG LVL3 (GOWN DISPOSABLE) ×3 IMPLANT
KIT ABG SYR 3ML LUER SLIP (SYRINGE) IMPLANT
NDL HYPO 25X5/8 SAFETYGLIDE (NEEDLE) IMPLANT
NEEDLE HYPO 25X5/8 SAFETYGLIDE (NEEDLE) IMPLANT
NS IRRIG 1000ML POUR BTL (IV SOLUTION) ×1 IMPLANT
PACK C SECTION WH (CUSTOM PROCEDURE TRAY) ×1 IMPLANT
PAD OB MATERNITY 4.3X12.25 (PERSONAL CARE ITEMS) ×1 IMPLANT
RTRCTR C-SECT PINK 25CM LRG (MISCELLANEOUS) ×1 IMPLANT
SUT MNCRL 0 VIOLET CTX 36 (SUTURE) ×2 IMPLANT
SUT MNCRL AB 3-0 PS2 27 (SUTURE) ×1 IMPLANT
SUT MNCRL AB 4-0 PS2 18 (SUTURE) ×1 IMPLANT
SUT VIC AB 0 CT1 27 (SUTURE) ×1
SUT VIC AB 0 CT1 27XBRD ANBCTR (SUTURE) ×1 IMPLANT
SUT VIC AB 0 CTX 36 (SUTURE) ×1
SUT VIC AB 0 CTX36XBRD ANBCTRL (SUTURE) ×1 IMPLANT
SUT VIC AB 2-0 CT1 27 (SUTURE) ×1
SUT VIC AB 2-0 CT1 TAPERPNT 27 (SUTURE) ×1 IMPLANT
SUT VIC AB 4-0 PS2 27 (SUTURE) ×1 IMPLANT
TOWEL OR 17X24 6PK STRL BLUE (TOWEL DISPOSABLE) ×1 IMPLANT
TRAY FOLEY W/BAG SLVR 14FR LF (SET/KITS/TRAYS/PACK) ×1 IMPLANT
WATER STERILE IRR 1000ML POUR (IV SOLUTION) ×1 IMPLANT

## 2023-03-31 NOTE — Op Note (Signed)
Date of procedure: 03/31/23  Pre-procedure diagnosis: 1. Single live intrauterine pregnancy at [redacted]w[redacted]d 2. Breech presentation 3. Premature prelabor rupture of membranes  Post-procedure diagnosis: 1. Single live intrauterine pregnancy at [redacted]w[redacted]d 2. Persistent breech presentation 3. Premature prelabor rupture of membranes  Procedure: External Cephalic Version Anesthesia: Epidural Surgeon: Dr. Harvie Bridge  Findings: Fetus in frank breech presentation; stable, reactive fetal heart rate pre and post procedure EBL: N/A  IVF: N/A  UOP: N/A Complications: None  Indication for procedure: Pt presents to L&D for ECV due to fetal malpresentation.  A reactive FHT was obtained prior to procedure.  Risks of ECV including abnormal fetal heart rate, PROM, abruption, injury to fetus, possible emergency c-section, and failed ECV were discussed with the pt and informed consent was obtained.  Informed consent was also obtained for c-section.  Pt elected for epidural prior to the procedure and this was placed without complication.  Details of Procedure: A bedside ultrasound was performed which confirmed the single intrauterine pregnancy and frank breech presentation.  There was noted to be adequate fluid.  Using manual pressure, the fetus was manipulated with gentle pressure from the palms against the buttock and posterior occiput to stimulate a forward roll.  Two attempts were made for version with a forward roll and two attempts were made for version with a backward roll. Fetal HR was obtained between each attempt and was reassuring.  The attempts were unsuccessful. FHT reactive after attempted version.  The patient and fetus tolerated the procedure well.   RH status: Positive  Will move to OR for cesarean delivery as previously discussed.  Harvie Bridge, MD Obstetrician & Gynecologist, [facility] for [employer], [facility]

## 2023-03-31 NOTE — Anesthesia Postprocedure Evaluation (Signed)
Anesthesia Post Note  Patient: Christina Cummings  Procedure(s) Performed: CESAREAN SECTION     Patient location during evaluation: PACU Anesthesia Type: Epidural Level of consciousness: oriented and awake and alert Pain management: pain level controlled Vital Signs Assessment: post-procedure vital signs reviewed and stable Respiratory status: spontaneous breathing, respiratory function stable and patient connected to nasal cannula oxygen Cardiovascular status: blood pressure returned to baseline and stable Postop Assessment: no headache, no backache and no apparent nausea or vomiting Anesthetic complications: no  No notable events documented.  Last Vitals:  Vitals:   03/31/23 1645 03/31/23 2000  BP: 110/67 103/61  Pulse:  (!) 103  Resp:  20  Temp:  37 C  SpO2:  100%    Last Pain:  Vitals:   03/31/23 2000  TempSrc: Oral  PainSc: 0-No pain   Pain Goal:                Epidural/Spinal Function Cutaneous sensation: Normal sensation (03/31/23 2000), Patient able to flex knees: Yes (03/31/23 2000), Patient able to lift hips off bed: Yes (03/31/23 2000), Back pain beyond tenderness at insertion site: No (03/31/23 2000), Progressively worsening motor and/or sensory loss: No (03/31/23 2000), Bowel and/or bladder incontinence post epidural: No (03/31/23 2000)  [name]

## 2023-03-31 NOTE — Lactation Note (Signed)
This note was copied from a baby's chart. Lactation Consultation Note  Patient Name: Girl Alyric Lones [id number] Date: 03/31/2023 Age:25 hours Reason for consult: Initial assessment;Late-preterm 34-36.6wks  P2- Infant was born on the cusp of the LPI/low birth weight guidelines. Infant was born at 36w 5d, weighing 3340g. [name]'s feeding plan has been to both formula and breast feed. LC encouraged [name] to breastfeed infant first, then offer supplementation. [name] verbalized understanding. LC reviewed LPI feeding plan in place for infant.  Feeding plan as follows: -Feedings may not exceed 30 minutes total. -Breastfeed first for no longer than 15 minutes. -Supplement with formula after for no longer than 15 minutes. -Supplementation for day 1 is a minimum of 14 mL, day 2 is 21 mL and day 3 is 28 mL. -Pump with DEBP after feeding to assist in stimulation.  [name] verbalized understanding and agreement to feeding plan. LC set up the DEBP with size 21 mm flanges. [name] did not want to try the pump at this time, so LC reviewed how to use it. [name] requested a pump for home usage. LC sent a WIC referral to her local WIC in [facility]. [name] declines having any further questions or concerns.  LC reviewed feeding infant on cue 8-12x in 24 hrs, not allowing infant to go over 3 hrs without a feeding, CDC milk storage guidelines and LC services handout. LC encouraged [name] to call for further assistance as needed.  Maternal Data Does the patient have breastfeeding experience prior to this delivery?: Yes How long did the patient breastfeed?: 3 months with first  Feeding Mother's Current Feeding Choice: Breast Milk and Formula Nipple Type: Slow - flow  Lactation Tools Discussed/Used Tools: Pump;Flanges Flange Size: 21 Breast pump type: Double-Electric Breast Pump;Manual Pump Education: Setup, frequency, and cleaning;Milk Storage Reason for Pumping: LPI Pumping frequency: 15-20 min every 2-3  hrs  Interventions Interventions: Breast feeding basics reviewed;DEBP;Hand pump;Education;LC Services brochure;LPT handout/interventions  Discharge Discharge Education: Warning signs for feeding baby WIC Program: Yes  Consult Status Consult Status: Follow-up Date: 04/01/23 Follow-up type: In-patient    Dema Severin BS, IBCLC 03/31/2023, 1:20 PM

## 2023-03-31 NOTE — Op Note (Signed)
Cesarean Section Procedure Note   Christina Cummings  03/31/2023  Indications: Breech Presentation   Pre-operative Diagnosis: Unscheduled Cesarean Section, See Delivery Summary.   Post-operative Diagnosis: Same   Surgeon: Surgeons and Role:    * Lazaro Arms, MD - Primary    * Wyn Forster, MD - Fellow   Attending Attestation: I was present and scrubbed for the entire procedure.   Assistants: Wyn Forster, MD - Fellow  An experienced assistant was required given the standard of surgical care given the complexity of the case.  This assistant was needed for exposure, dissection, suctioning, retraction, instrument exchange, and for overall help during the procedure.  Anesthesia: epidural    Estimated Blood Loss: 477 ml  Total IV Fluids: 1000 ml   Urine Output:  1900 CC OF clear urine  Specimens: Placenta to pathology, calcifications  Findings: Breech infant with bilateral nuchal arms  Baby condition / location:  Couplet care / Skin to Skin APGAR: 9, 9; weight 3340 gm  .    Complications: no complications  Indications: Christina Cummings is a 27 y.o. [phone] with an IUP [redacted]w[redacted]d presenting with PROM, and found to be breech, ECV attempted but unsuccessful. Breech vaginal delivery considered, but maternal request for pCS.  The risks, benefits, complications, treatment options, and expected outcomes were discussed with the patient . The patient concurred with the proposed plan, giving informed consent. identified as [name] and the procedure verified as C-Section Delivery.  Procedure Details: A Time Out was held and the above information confirmed.  The patient was taken back to the operative suite where epidural anesthesia was placed.  After induction of anesthesia, the patient was draped and prepped in the usual sterile manner and placed in a dorsal supine position with a leftward tilt. A transverse was made and carried down through the subcutaneous tissue to the fascia.  Fascial incision was made and extended transversely. The fascia was separated from the underlying rectus tissue superiorly and inferiorly. The peritoneum was identified and entered. Peritoneal incision was extended longitudinally. The utero-vesical peritoneal reflection was incised transversely and the bladder flap was bluntly freed from the lower uterine segment. A low transverse uterine incision was made. Delivered from breech with bilateral nuchal arms presentation was a 3340 gram Living newborn infant(s) with Apgar scores of 9 at one minute and 9 at five minutes. Cord ph was not sent the umbilical cord was clamped and cut cord blood was obtained for evaluation. The placenta was removed Intact and appeared normal. The uterine outline, tubes and ovaries appeared normal}. The uterine incision was closed with running locked sutures of 0 Monocryl.   Hemostasis was observed. Peritoneum was re-approximated with 2.0 Vicryl. The fascia was then reapproximated with running sutures of 2-0 Vicryl. The skin was closed with 3-0Vicryl.   Instrument, sponge, and needle counts were correct prior the abdominal closure and were correct at the conclusion of the case.   Disposition: PACU - hemodynamically stable.   Maternal Condition: stable   Signed: Stetson Pelaez LevequeMD 03/31/2023 5:23 PM

## 2023-03-31 NOTE — Progress Notes (Signed)
Progress Note In to review r/b ov ECV and sign consent. Reviewed risks as previously outlined. Patient would like to proceed with ECV.   We discussed possibility of breech vaginal delivery with my oncoming partner or CS for breech if ECV fails. The risks of surgery were discussed with the patient including but were not limited to: bleeding which may require transfusion, reoperation, or additional procedures like hysterectomy in the event of life-threatening hemorrhage; infection which may require antibiotics; injury to bowel, bladder, ureters or other surrounding organs; injury to the fetus; formation of adhesions; placental abnormalities with subsequent pregnancies; incisional problems; thromboembolic phenomenon and other postoperative/anesthesia complications.    The patient concurred with the proposed plan, giving informed written consent for the procedure.   Patient has been NPO for >8 hours and she will remain NPO for procedure. Anesthesia and OR aware.   Harvie Bridge, MD Obstetrician & Gynecologist, [facility] for [employer], [facility]

## 2023-03-31 NOTE — Anesthesia Procedure Notes (Signed)
Epidural Patient location during procedure: OR Start time: 03/31/2023 6:49 AM End time: 03/31/2023 6:55 AM  Staffing Anesthesiologist: Beryle Lathe, MD Performed: anesthesiologist   Preanesthetic Checklist Completed: patient identified, IV checked, risks and benefits discussed, surgical consent, monitors and equipment checked, pre-op evaluation and timeout performed  Epidural Patient position: sitting Prep: DuraPrep Patient monitoring: continuous pulse ox and blood pressure Approach: midline Location: L3-L4 Injection technique: LOR air  Needle:  Needle type: Tuohy  Needle gauge: 17 G Needle length: 9 cm Needle insertion depth: 6.5 cm Catheter type: closed end flexible Catheter size: 19 Gauge Catheter at skin depth: 11 cm Epidural test dose: No test dose given due to administration of intrathecal medication.  Assessment Events: blood not aspirated, injection not painful, no injection resistance, no paresthesia and negative IV test  Additional Notes Patient identified. Risks including, but not limited to, bleeding, infection, nerve damage, paralysis, inadequate analgesia, blood pressure changes, nausea, vomiting, allergic reaction, postpartum back pain, itching, and headache were discussed. Patient expressed understanding and wished to proceed. Sterile prep and drape, including hand hygiene, mask, and sterile gloves were used. The patient was positioned and the spine was prepped. The skin was anesthetized with lidocaine. The Tuohy was advanced until LOR was achieved. A 25ga Whitacre needle was advanced through the Tuohy. Free flow of clear CSF was obtained prior to injecting local anesthetic into the CSF. The spinal needle aspirated freely following injection. The spinal needle was carefully withdrawn. The epidural catheter was then advanced into the epidural space via the Tuohy needle. The Tuohy needle was then withdrawn and the epidural catheter was taped into place. No  paraesthesia or other complications noted. The patient tolerated the procedure well.   Leslye Peer, MDReason for block:surgical anesthesia

## 2023-03-31 NOTE — H&P (Signed)
LABOR AND DELIVERY ADMISSION HISTORY AND PHYSICAL NOTE  Christina Cummings is a 27 y.o. female G2P0000 with IUP at [redacted]w[redacted]d presenting for PROM.   Patient presented to MAU for ROM and irregular ctx. Incidentally found to be breech.  Patient reports the fetal movement as active. Patient reports uterine contraction  activity as irregular. Patient reports  vaginal bleeding as none. Patient describes fluid per vagina as initially lightly green-tinged, now clear.   Patient denies headache, vision changes, chest pain, shortness of breath, right upper quadrant pain, or LE edema.  She plans on breast feeding feeding. Her contraception plan is:  undecided .  Prenatal History/Complications: PNC at [facility]:  @[redacted]w[redacted]d , CWD, normal anatomy, anterior placenta, EFW 260 g  Pregnancy complications:  Patient Active Problem List   Diagnosis Date Noted   Normal labor 03/31/2023    Past Medical History: Past Medical History:  Diagnosis Date   Hx of gonorrhea 06/2017   Medical history non-contributory    Seasonal allergies     Past Surgical History: Past Surgical History:  Procedure Laterality Date   NO PAST SURGERIES      Obstetrical History: OB History     Gravida  2   Para  0   Term  0   Preterm  0   AB  0   Living  0      SAB  0   IAB  0   Ectopic  0   Multiple  0   Live Births  0           Social History: Social History   Socioeconomic History   Marital status: Single    Spouse name: Not on file   Number of children: Not on file   Years of education: Not on file   Highest education level: Not on file  Occupational History   Not on file  Tobacco Use   Smoking status: Former    Current packs/day: 0.50    Types: Cigarettes   Smokeless tobacco: Never  Vaping Use   Vaping status: Never Used  Substance and Sexual Activity   Alcohol use: No   Drug use: No   Sexual activity: Yes    Birth control/protection: None  Other Topics Concern   Not on file   Social History Narrative   Not on file   Social Determinants of Health   Financial Resource Strain: Low Risk  (07/11/2022)   Received from [facility], [facility]   Overall Financial Resource Strain (CARDIA)    Difficulty of Paying Living Expenses: Not hard at all  Recent Concern: Financial Resource Strain - Medium Risk (05/27/2022)   Received from [facility]   Overall Financial Resource Strain (CARDIA)    Difficulty of Paying Living Expenses: Somewhat hard  Food Insecurity: No Food Insecurity (02/19/2023)   Received from [facility]   Hunger Vital Sign    Worried About Running Out of Food in the Last Year: Never true    Ran Out of Food in the Last Year: Never true  Transportation Needs: No Transportation Needs (07/11/2022)   Received from [facility], [facility]   PRAPARE - Transportation    Lack of Transportation (Medical): No    Lack of Transportation (Non-Medical): No  Physical Activity: Not on file  Stress: No Stress Concern Present (05/27/2022)   Received from [facility], [facility] of Occupational Health - Occupational Stress Questionnaire    Feeling of Stress : Not at  all  Social Connections: Unknown (06/16/2022)   Received from [facility], [facility]   Social Connection and Isolation Panel [NHANES]    Frequency of Communication with Friends and Family: Three times a week    Frequency of Social Gatherings with Friends and Family: Twice a week    Attends Religious Services: Not on Marketing executive or Organizations: Not on file    Attends Banker Meetings: Not on file    Marital Status: Not on file    Family History: Family History  Problem Relation Age of Onset   Sleep apnea Father     Allergies: No Known Allergies  Medications Prior to Admission  Medication Sig Dispense Refill Last Dose   amoxicillin (AMOXIL) 500 MG capsule Take 1 capsule (500 mg total) by mouth 2 (two) times daily. 20  capsule 0    promethazine (PHENERGAN) 25 MG tablet Take 0.5-1 tablets (12.5-25 mg total) by mouth every 6 (six) hours as needed for nausea or vomiting. 30 tablet 0      Review of Systems  All systems reviewed and negative except as stated in HPI  Physical Exam BP 121/78 (BP Location: Right Arm)   Pulse (!) 124   Temp 98.5 F (36.9 C) (Oral)   Resp 17   Ht 5\' 8"  (1.727 m)   Wt 111.9 kg   SpO2 99%   BMI 37.50 kg/m   Physical Exam Constitutional:      General: She is not in acute distress.    Appearance: She is not ill-appearing.  Cardiovascular:     Rate and Rhythm: Normal rate and regular rhythm.  Pulmonary:     Effort: Pulmonary effort is normal.  Abdominal:     Comments: Gravid  Musculoskeletal:        General: No swelling.  Skin:    General: Skin is warm and dry.  Neurological:     General: No focal deficit present.  Psychiatric:        Mood and Affect: Mood normal.   Presentation: breech, suspected by check, confirmed by ultrasound  Fetal monitoring: Baseline: 130 bpm, Variability: Good {> 6 bpm), Accelerations: Reactive, and Decelerations: Absent Uterine activity: irregular  Dilation: 4.5 Effacement (%): 60 Station: -2 Presentation:  (breech) Exam by:: Fabiola Backer, RN  Prenatal labs: ABO, Rh: --/--/PENDING (10/25 2353) Antibody: PENDING (10/25 2353) Rubella:   RPR:    HBsAg:    HIV:    GC/Chlamydia:  Neisseria Gonorrhea  Date Value Ref Range Status  10/31/2021 Negative  Final   Chlamydia  Date Value Ref Range Status  10/31/2021 Negative  Final   GBS:      Prenatal Transfer Tool  Maternal Diabetes: No Genetic Screening: Normal Maternal Ultrasounds/Referrals: Normal Fetal Ultrasounds or other Referrals:  None Maternal Substance Abuse:  No Significant Maternal Medications:  None Significant Maternal Lab Results: Other: GBS unknown  Results for orders placed or performed during the hospital encounter of 03/30/23 (from the past 24  hour(s))  Type and screen [facility]   Collection Time: 03/30/23 11:53 PM  Result Value Ref Range   ABO/RH(D) PENDING    Antibody Screen PENDING    Sample Expiration      04/02/2023,2359 Performed at [facility], [address], [place], [place] [address]     Assessment: Christina Cummings is a 27 y.o. G2P0000 at [redacted]w[redacted]d here for PROM. Incidentally found to be breech.  #Labor: Proceed with ECV. Please  see Dr. Aneta Mins note for details and consent #FHT: Category I #GBS/ID: Unknown > PCN as pt is preterm #MOF: breast feeding #MOC:  undecided  Joanne Gavel, MD [facility] for [employer] [facility], [facility]  03/31/2023, 12:17 AM

## 2023-03-31 NOTE — Progress Notes (Signed)
Progress Note  Ms. Holtgrewe is a [birth year] at [redacted]w[redacted]d with PROM. Fetus is breech.  SCE 4/60/-2, patient comfortable, does not appear to be actively laboring.  Hx SVD x1, pelvis proven to 7lb11oz.  FHT 140bpm, moderate, no accels, no decels Toco q21min  Discussed r/b of external cephalic version versus proceeding with cesarean delivery. We reviewed that a version would be done with epidural in place and terbutaline to facilitate successful version to cephalic presentation. Discussed risks include fetal intolerance, abruption, cord prolapse all which would necessitate an emergent cesarean delivery. After discussion, pt would like to try external cephalic version.   OR/anesthesia notified. Pt will be moved to PACU. Will consent for CS prior to version.   Harvie Bridge, MD Obstetrician & Gynecologist, [facility] for [employer], [facility]

## 2023-03-31 NOTE — Discharge Summary (Signed)
Postpartum Discharge Summary  Date of Service updated***     Patient Name: Christina Cummings DOB: [birth year] MRN: [id number]  Date of admission: 03/30/2023 Delivery date:03/31/2023 Delivering provider: Lazaro Arms Date of discharge: 03/31/2023  Admitting diagnosis: Normal labor [O80, Z37.9] Preterm premature rupture of membranes (PPROM) with unknown onset of labor [O42.919] Intrauterine pregnancy: [redacted]w[redacted]d     Secondary diagnosis:  Principal Problem:   Normal labor Active Problems:   Preterm premature rupture of membranes (PPROM) with unknown onset of labor   S/P primary low transverse C-section  Additional problems: PROM, breech    Discharge diagnosis: Preterm Pregnancy Delivered                                              Post partum procedures:{Postpartum procedures:23558} Augmentation: N/A Complications: {OB Labor/Delivery Complications:20784}  Hospital course: Sceduled C/S   27 y.o. yo [id number] at [redacted]w[redacted]d was admitted to the hospital 03/30/2023 for scheduled cesarean section with the following indication:Malpresentation.Delivery details are as follows:  Membrane Rupture Time/Date: 10:30 PM,03/30/2023  Delivery Method:C-Section, Low Transverse Operative Delivery:N/A Details of operation can be found in separate operative note.  Patient had a postpartum course complicated by***.  She is ambulating, tolerating a regular diet, passing flatus, and urinating well. Patient is discharged home in stable condition on  03/31/23        Newborn Data: Birth date:03/31/2023 Birth time:10:00 AM Gender:Female Living status:Living Apgars:9 ,9  Weight:3340 g    Magnesium Sulfate received: {Mag received:30440022} BMZ received: No Rhophylac:N/A MMR:Yes T-DaP:{Tdap:23962} Flu: {WNU:27253} RSV Vaccine received: {RSV:31013} Transfusion:{Transfusion received:30440034}  Immunizations received: There is no immunization history for the selected administration types on file for this  patient.  Physical exam  Vitals:   03/31/23 1237 03/31/23 1337 03/31/23 1430 03/31/23 1645  BP: 117/71 119/77 (!) 111/51 110/67  Pulse: 87 89 (!) 103   Resp:   18   Temp:  97.9 F (36.6 C) 98.2 F (36.8 C)   TempSrc:  Axillary Oral   SpO2: 99% 98% 99%   Weight:      Height:       General: {Exam; general:21111117} Lochia: {Desc; appropriate/inappropriate:30686::"appropriate"} Uterine Fundus: {Desc; firm/soft:30687} Incision: {Exam; incision:21111123} DVT Evaluation: {Exam; dvt:2111122} Labs: Lab Results  Component Value Date   WBC 7.3 03/30/2023   HGB 9.0 (L) 03/30/2023   HCT 30.4 (L) 03/30/2023   MCV 73.3 (L) 03/30/2023   PLT 212 03/30/2023      Latest Ref Rng & Units 04/17/2018    8:53 AM  CMP  Glucose 70 - 99 mg/dL 664   BUN 6 - 20 mg/dL 11   Creatinine 4.03 - 1.00 mg/dL 4.74   Sodium 259 - 563 mmol/L 134   Potassium 3.5 - 5.1 mmol/L 3.5   Chloride 98 - 111 mmol/L 101   CO2 22 - 32 mmol/L 25   Calcium 8.9 - 10.3 mg/dL 9.2   Total Protein 6.5 - 8.1 g/dL 8.8   Total Bilirubin 0.3 - 1.2 mg/dL 0.4   Alkaline Phos 38 - 126 U/L 44   AST 15 - 41 U/L 22   ALT 0 - 44 U/L 10    Edinburgh Score:    03/31/2023    1:37 PM  Edinburgh Postnatal Depression Scale Screening Tool  I have been able to laugh and see the funny side of things. --   No  data recorded  After visit meds:  Allergies as of 03/31/2023   No Known Allergies   Med Rec must be completed prior to using this [facility]***        Discharge home in stable condition Infant Feeding: Bottle Infant Disposition:home with mother Discharge instruction: per After Visit Summary and Postpartum booklet. Activity: Advance as tolerated. Pelvic rest for 6 weeks.  Diet: {OB ZOXW:96045409} Future Appointments:No future appointments. Follow up Visit:  Follow-up Information     Ob/Gyn, [facility] Follow up in 1 week(s).   Why: Call for a follow up, to check your incision in 1 week. Contact  information: [address] [place] [address] [phone]                Message sent to [facility] (previous seen at [facility]) 10/26  Please schedule this patient for a In person postpartum visit in 1 week with the following provider: Any provider. Additional Postpartum F/U:Postpartum Depression checkup and Incision check 1 week  Low risk pregnancy complicated by:  Breech, PROM Delivery mode:  C-Section, Low Transverse Anticipated Birth Control:  Unsure   03/31/2023 Wyn Forster, MD

## 2023-03-31 NOTE — Anesthesia Postprocedure Evaluation (Signed)
Anesthesia Post Note  Patient: Christina Cummings  Procedure(s) Performed: VERSION        Patient location during evaluation: PACU Anesthesia Type: Combined Spinal/Epidural Level of consciousness: oriented and awake and alert Pain management: pain level controlled Vital Signs Assessment: post-procedure vital signs reviewed and stable Respiratory status: spontaneous breathing, respiratory function stable and patient connected to nasal cannula oxygen Cardiovascular status: blood pressure returned to baseline and stable Postop Assessment: no headache, no backache, no apparent nausea or vomiting and spinal receding Anesthetic complications: no  No notable events documented.  Last Vitals:  Vitals:   03/31/23 1200 03/31/23 1237  BP: 111/69 117/71  Pulse: 96 87  Resp: 18   Temp:    SpO2:  99%    Last Pain:  Vitals:   03/31/23 1242  TempSrc:   PainSc: 0-No pain   Pain Goal:                Epidural/Spinal Function Cutaneous sensation: Able to Wiggle Toes (03/31/23 1242), Patient able to flex knees: Yes (03/31/23 1242), Patient able to lift hips off bed: Yes (03/31/23 1242), Back pain beyond tenderness at insertion site: No (03/31/23 1242), Progressively worsening motor and/or sensory loss: No (03/31/23 1242), Bowel and/or bladder incontinence post epidural: No (03/31/23 1242)  Shamiya Demeritt L Audrea Bolte

## 2023-03-31 NOTE — Anesthesia Preprocedure Evaluation (Signed)
Anesthesia Evaluation  Patient identified by MRN, date of birth, ID band Patient awake    Reviewed: Allergy & Precautions, NPO status , Patient's Chart, lab work & pertinent test results  Airway Mallampati: II  TM Distance: >3 FB Neck ROM: Full    Dental no notable dental hx.    Pulmonary neg pulmonary ROS, former smoker   Pulmonary exam normal breath sounds clear to auscultation       Cardiovascular negative cardio ROS Normal cardiovascular exam Rhythm:Regular Rate:Normal     Neuro/Psych negative neurological ROS  negative psych ROS   GI/Hepatic negative GI ROS, Neg liver ROS,,,  Endo/Other  negative endocrine ROS    Renal/GU negative Renal ROS  negative genitourinary   Musculoskeletal negative musculoskeletal ROS (+)    Abdominal   Peds  Hematology  (+) Blood dyscrasia, anemia Lab Results      Component                Value               Date                      WBC                      7.3                 03/30/2023                HGB                      9.0 (L)             03/30/2023                HCT                      30.4 (L)            03/30/2023                MCV                      73.3 (L)            03/30/2023                PLT                      212                 03/30/2023              Anesthesia Other Findings Presented in labor with breech presentation, failed ECV, epidural in place from version and functioned well  Reproductive/Obstetrics (+) Pregnancy                             Anesthesia Physical Anesthesia Plan  ASA: 2  Anesthesia Plan: Epidural   Post-op Pain Management:    Induction:   PONV Risk Score and Plan: Treatment may vary due to age or medical condition  Airway Management Planned: Natural Airway  Additional Equipment:   Intra-op Plan:   Post-operative Plan:   Informed Consent: I have reviewed the patients History and Physical,  chart, labs and discussed the procedure including the risks, benefits and alternatives for the proposed anesthesia with  the patient or authorized representative who has indicated his/her understanding and acceptance.     Dental advisory given  Plan Discussed with: CRNA  Anesthesia Plan Comments:        Anesthesia Quick Evaluation

## 2023-03-31 NOTE — Transfer of Care (Signed)
Immediate Anesthesia Transfer of Care Note  Patient: Christina Cummings  Procedure(s) Performed: CESAREAN SECTION  Patient Location: PACU  Anesthesia Type:Epidural  Level of Consciousness: awake, alert , oriented, and patient cooperative  Airway & Oxygen Therapy: Patient Spontanous Breathing  Post-op Assessment: Report given to RN and Post -op Vital signs reviewed and stable  Post vital signs: Reviewed and stable  Last Vitals:  Vitals Value Taken Time  BP 109/62 03/31/23 1100  Temp    Pulse 95 03/31/23 1102  Resp 16 03/31/23 1102  SpO2 99 % 03/31/23 1102  Vitals shown include unfiled device data.  Last Pain:  Vitals:   03/31/23 0700  TempSrc:   PainSc: 0-No pain         Complications: No notable events documented.

## 2023-03-31 NOTE — Anesthesia Preprocedure Evaluation (Signed)
Anesthesia Evaluation  Patient identified by MRN, date of birth, ID band Patient awake    Reviewed: Allergy & Precautions, NPO status , Patient's Chart, lab work & pertinent test results  History of Anesthesia Complications Negative for: history of anesthetic complications  Airway Mallampati: II   Neck ROM: Full    Dental   Pulmonary neg pulmonary ROS, former smoker   Pulmonary exam normal        Cardiovascular negative cardio ROS Normal cardiovascular exam     Neuro/Psych negative neurological ROS  negative psych ROS   GI/Hepatic negative GI ROS, Neg liver ROS,,,  Endo/Other  negative endocrine ROS   Obesity   Renal/GU negative Renal ROS     Musculoskeletal negative musculoskeletal ROS (+)    Abdominal   Peds  Hematology  (+) Blood dyscrasia, anemia  Plt 212k    Anesthesia Other Findings   Reproductive/Obstetrics (+) Pregnancy                             Anesthesia Physical Anesthesia Plan  ASA: 2  Anesthesia Plan: Combined Spinal and Epidural   Post-op Pain Management: Minimal or no pain anticipated   Induction:   PONV Risk Score and Plan: 2 and Treatment may vary due to age or medical condition, Ondansetron and Scopolamine patch - Pre-op  Airway Management Planned: Natural Airway  Additional Equipment: None  Intra-op Plan:   Post-operative Plan:   Informed Consent: I have reviewed the patients History and Physical, chart, labs and discussed the procedure including the risks, benefits and alternatives for the proposed anesthesia with the patient or authorized representative who has indicated his/her understanding and acceptance.       Plan Discussed with: CRNA and Anesthesiologist  Anesthesia Plan Comments: (Labs reviewed. Platelets acceptable, patient not taking any blood thinning medications. Risks and benefits discussed with patient, including PDPH, backache,  epidural hematoma, failed spinal/epidural, blood pressure changes, allergic reaction, and nerve injury. Patient expressed understanding and wished to proceed.)       Anesthesia Quick Evaluation

## 2023-04-01 LAB — CBC
HCT: 29.8 % — ABNORMAL LOW (ref 36.0–46.0)
Hemoglobin: 8.6 g/dL — ABNORMAL LOW (ref 12.0–15.0)
MCH: 21.4 pg — ABNORMAL LOW (ref 26.0–34.0)
MCHC: 28.9 g/dL — ABNORMAL LOW (ref 30.0–36.0)
MCV: 74.3 fL — ABNORMAL LOW (ref 80.0–100.0)
Platelets: 227 10*3/uL (ref 150–400)
RBC: 4.01 MIL/uL (ref 3.87–5.11)
RDW: 18.7 % — ABNORMAL HIGH (ref 11.5–15.5)
WBC: 15.5 10*3/uL — ABNORMAL HIGH (ref 4.0–10.5)
nRBC: 0.6 % — ABNORMAL HIGH (ref 0.0–0.2)

## 2023-04-01 MED ORDER — FERROUS SULFATE 325 (65 FE) MG PO TABS
325.0000 mg | ORAL_TABLET | ORAL | Status: DC
  Administered 2023-04-01 – 2023-04-03 (×2): 325 mg via ORAL
  Filled 2023-04-01 (×2): qty 1

## 2023-04-01 NOTE — Progress Notes (Addendum)
POSTPARTUM PROGRESS NOTE  Subjective: Christina Cummings is a 27 y.o. B2W4132 s/p pLTCS at [redacted]w[redacted]d for PPROM and breech presentation.  She reports she is doing well. No acute events overnight. She denies any problems with ambulating, voiding or po intake. Denies nausea or vomiting. She has passed flatus. Pain is well controlled.  Lochia is like a period.  Objective: Blood pressure 112/73, pulse 90, temperature 97.9 F (36.6 C), temperature source Oral, resp. rate 20, height 5\' 8"  (1.727 m), weight 111.9 kg, SpO2 100%, unknown if currently breastfeeding.  Physical Exam:  General: alert, cooperative and no distress Chest: no respiratory distress Abdomen: soft, non-tender  Uterine Fundus: firm and at level of umbilicus Extremities: No calf swelling or tenderness  Trace edema  Recent Labs    03/30/23 2353 04/01/23 0438  HGB 9.0* 8.6*  HCT 30.4* 29.8*    Assessment/Plan: ABELA HALBRITTER is a 27 y.o. G4W1027 s/p pLTCS at [redacted]w[redacted]d for PPROM and breech.  Routine Postpartum Care: Doing well, pain well-controlled.  -- Continue routine care, lactation support  -- Contraception: declines -- Feeding: breast  -- Anemia: start PO iron  Dispo: Plan for discharge 10/28-10/29.  Joanne Gavel, MD OB Fellow 04/01/2023 6:27 AM

## 2023-04-02 MED ORDER — OXYCODONE HCL 5 MG PO TABS
5.0000 mg | ORAL_TABLET | ORAL | Status: DC | PRN
  Filled 2023-04-02: qty 1

## 2023-04-02 MED ORDER — ACETAMINOPHEN 500 MG PO TABS
1000.0000 mg | ORAL_TABLET | Freq: Four times a day (QID) | ORAL | Status: DC
  Administered 2023-04-02 – 2023-04-03 (×5): 1000 mg via ORAL
  Filled 2023-04-02 (×5): qty 2

## 2023-04-02 NOTE — Discharge Instructions (Signed)
WHAT TO LOOK OUT FOR: Fever of 100.4 or above Mastitis: feels like flu and breasts hurt Infection: increased pain, swelling or redness Blood clots golf ball size or larger Postpartum depression   Congratulations on your newest addition!WHAT TO LOOK OUT FOR: Fever of 100.4 or above Mastitis: feels like flu and breasts hurt Infection: increased pain, swelling or redness Blood clots golf ball size or larger Postpartum depression   Congratulations on your newest addition!

## 2023-04-02 NOTE — Progress Notes (Signed)
POSTPARTUM PROGRESS NOTE  POD #2  Subjective:  Christina Cummings is a 27 y.o. M8U1324 s/p pLTCS at [redacted]w[redacted]d. No acute events overnight. She reports she is doing well. She denies any problems with ambulating, voiding or po intake. Denies nausea or vomiting. She has passed flatus. Pain is well controlled.  Lochia is appropriate.  Objective: Blood pressure 117/77, pulse 96, temperature 98.8 F (37.1 C), temperature source Oral, resp. rate 18, height 5\' 8"  (1.727 m), weight 111.9 kg, SpO2 100%, unknown if currently breastfeeding.  Physical Exam:  General: alert, cooperative and no distress Chest: no respiratory distress Heart: regular rate, distal pulses intact Uterine Fundus: firm, appropriately tender DVT Evaluation: No calf swelling or tenderness Extremities: mild edema Skin: warm, dry; incision clean/dry/intact w/honeycomb dressing in place  Recent Labs    03/30/23 2353 04/01/23 0438  HGB 9.0* 8.6*  HCT 30.4* 29.8*    Assessment/Plan: ESSANCE MCSPADDEN is a 27 y.o. M0N0272 s/p pLTCS at [redacted]w[redacted]d for PPROM and breech.  POD#2 - Doing welll; pain well controlled. H/H appropriate  Routine postpartum care  OOB, ambulated  Lovenox for VTE prophylaxis Anemia: asymptomatic  Start po ferrous sulfate every other day  Contraception: declines Feeding: breast  Dispo: Plan for discharge 10/29.   LOS: 2 days   Wyn Forster, MD OB Fellow  04/02/2023, 7:15 AM

## 2023-04-02 NOTE — Lactation Note (Addendum)
This note was copied from a baby's chart. Lactation Consultation Note  Patient Name: Christina Cummings Date: 04/02/2023 Age:27 years Reason for consult: Follow-up assessment;Late-preterm 34-36.6wks P2, Per [name], she has not latched infant to breast but wants to, only been formula feeding infant and not using DEBP. LC unable assist with latch due to infant receiving 25 mls of 22 kcal formula.  [name] concern about her milk supply, LC assisted with using hand pump and reviewing how to use the DEBP, [name] expressed 4 mls in breast flanges and was still pumping when LC left the room.   [name] informed LC she exclusively BF her 68 month old son for 2 months. LC reviewed LPTI infant feeding policy.   Today's feeding plan: 1- [name] will continue to feed infant every 3 hours and plans to latch LPTI to breast but will limit chest feeding to 10 minutes or less and afterwards will offer her EBM that she pumped and then formula. [name] knows on day 3 of life to supplement infant with 28 mls or more of EBM/Formula per feeding.  2-[name] knows to call for latch assistance if needed. 3- [name] will continue to use DEBP every 3 hours for 15 minutes on initial setting, she is happy to see that she has colostrum. 4- LC discussed that [name]'s EBM is safe for 4 hours whereas formula once open must be used within 1 hour at room temperature.  Maternal Data    Feeding Mother's Current Feeding Choice: Breast Milk and Formula Nipple Type: Dr. Levert Feinstein Preemie  LATCH Score                    Lactation Tools Discussed/Used Tools: Pump Flange Size: 21 Breast pump type: Double-Electric Breast Pump Pump Education: Setup, frequency, and cleaning;Milk Storage Reason for Pumping: LPTI Pumped volume: 5 mL (LC reviewed using DEBP, [name] not pumped today, concerned about milk supply, only been formula feeding infant but wants to BF.)  Interventions    Discharge    Consult Status Consult Status: Follow-up Date:  04/03/23 Follow-up type: In-patient    Frederico Hamman 04/02/2023, 6:37 PM

## 2023-04-02 NOTE — Plan of Care (Signed)
  Problem: Education: Goal: Knowledge of Childbirth will improve Outcome: Progressing Goal: Ability to make informed decisions regarding treatment and plan of care will improve Outcome: Progressing Goal: Ability to state and carry out methods to decrease the pain will improve Outcome: Progressing Goal: Individualized Educational Video(s) Outcome: Progressing   Problem: Coping: Goal: Ability to verbalize concerns and feelings about labor and delivery will improve Outcome: Progressing   Problem: Life Cycle: Goal: Ability to make normal progression through stages of labor will improve Outcome: Progressing Goal: Ability to effectively push during vaginal delivery will improve Outcome: Progressing   Problem: Role Relationship: Goal: Will demonstrate positive interactions with the child Outcome: Progressing   Problem: Safety: Goal: Risk of complications during labor and delivery will decrease Outcome: Progressing   Problem: Pain Management: Goal: Relief or control of pain from uterine contractions will improve Outcome: Progressing   Problem: Health Behavior/Discharge Planning: Goal: Ability to manage health-related needs will improve Outcome: Progressing   Problem: Clinical Measurements: Goal: Ability to maintain clinical measurements within normal limits will improve Outcome: Progressing Goal: Will remain free from infection Outcome: Progressing Goal: Respiratory complications will improve Outcome: Progressing Goal: Cardiovascular complication will be avoided Outcome: Progressing   Problem: Coping: Goal: Level of anxiety will decrease Outcome: Progressing   Problem: Elimination: Goal: Will not experience complications related to bowel motility Outcome: Progressing   Problem: Pain Management: Goal: General experience of comfort will improve Outcome: Progressing   Problem: Safety: Goal: Ability to remain free from injury will improve Outcome: Progressing    Problem: Skin Integrity: Goal: Risk for impaired skin integrity will decrease Outcome: Progressing   Problem: Education: Goal: Knowledge of the prescribed therapeutic regimen will improve Outcome: Progressing Goal: Understanding of sexual limitations or changes related to disease process or condition will improve Outcome: Progressing Goal: Individualized Educational Video(s) Outcome: Progressing   Problem: Self-Concept: Goal: Communication of feelings regarding changes in body function or appearance will improve Outcome: Progressing   Problem: Skin Integrity: Goal: Demonstration of wound healing without infection will improve Outcome: Progressing   Problem: Education: Goal: Knowledge of condition will improve Outcome: Progressing Goal: Individualized Educational Video(s) Outcome: Progressing Goal: Individualized Newborn Educational Video(s) Outcome: Progressing   Problem: Education: Goal: Knowledge of General Education information will improve Description: Including pain rating scale, medication(s)/side effects and non-pharmacologic comfort measures Outcome: Progressing   Problem: Health Behavior/Discharge Planning: Goal: Ability to manage health-related needs will improve Outcome: Progressing   Problem: Clinical Measurements: Goal: Ability to maintain clinical measurements within normal limits will improve Outcome: Progressing Goal: Will remain free from infection Outcome: Progressing Goal: Diagnostic test results will improve Outcome: Progressing Goal: Respiratory complications will improve Outcome: Progressing Goal: Cardiovascular complication will be avoided Outcome: Progressing   Problem: Nutrition: Goal: Adequate nutrition will be maintained Outcome: Progressing   Problem: Coping: Goal: Level of anxiety will decrease Outcome: Progressing   Problem: Elimination: Goal: Will not experience complications related to bowel motility Outcome: Progressing    Problem: Pain Management: Goal: General experience of comfort will improve Outcome: Progressing   Problem: Safety: Goal: Ability to remain free from injury will improve Outcome: Progressing   Problem: Skin Integrity: Goal: Risk for impaired skin integrity will decrease Outcome: Progressing

## 2023-04-03 ENCOUNTER — Ambulatory Visit (HOSPITAL_COMMUNITY): Payer: Self-pay

## 2023-04-03 DIAGNOSIS — O321XX0 Maternal care for breech presentation, not applicable or unspecified: Secondary | ICD-10-CM | POA: Diagnosis present

## 2023-04-03 LAB — SURGICAL PATHOLOGY

## 2023-04-03 MED ORDER — OXYCODONE HCL 5 MG PO TABS: 5.0000 mg | ORAL_TABLET | ORAL | 0 refills | Status: DC | PRN

## 2023-04-03 MED ORDER — FERROUS SULFATE 325 (65 FE) MG PO TABS: 325.0000 mg | ORAL_TABLET | ORAL | 3 refills | Status: AC

## 2023-04-03 MED ORDER — IBUPROFEN 600 MG PO TABS: 600.0000 mg | ORAL_TABLET | Freq: Four times a day (QID) | ORAL | 0 refills | Status: DC | PRN

## 2023-04-03 NOTE — Plan of Care (Signed)
  Problem: Education: Goal: Knowledge of Childbirth will improve Outcome: Progressing Goal: Ability to make informed decisions regarding treatment and plan of care will improve Outcome: Progressing Goal: Ability to state and carry out methods to decrease the pain will improve Outcome: Progressing Goal: Individualized Educational Video(s) Outcome: Progressing   Problem: Coping: Goal: Ability to verbalize concerns and feelings about labor and delivery will improve Outcome: Progressing   Problem: Life Cycle: Goal: Ability to make normal progression through stages of labor will improve Outcome: Progressing Goal: Ability to effectively push during vaginal delivery will improve Outcome: Progressing   Problem: Role Relationship: Goal: Will demonstrate positive interactions with the child Outcome: Progressing   Problem: Safety: Goal: Risk of complications during labor and delivery will decrease Outcome: Progressing   Problem: Pain Management: Goal: Relief or control of pain from uterine contractions will improve Outcome: Progressing   Problem: Health Behavior/Discharge Planning: Goal: Ability to manage health-related needs will improve Outcome: Progressing   Problem: Clinical Measurements: Goal: Ability to maintain clinical measurements within normal limits will improve Outcome: Progressing Goal: Will remain free from infection Outcome: Progressing Goal: Respiratory complications will improve Outcome: Progressing Goal: Cardiovascular complication will be avoided Outcome: Progressing   Problem: Coping: Goal: Level of anxiety will decrease Outcome: Progressing   Problem: Elimination: Goal: Will not experience complications related to bowel motility Outcome: Progressing   Problem: Pain Management: Goal: General experience of comfort will improve Outcome: Progressing   Problem: Safety: Goal: Ability to remain free from injury will improve Outcome: Progressing    Problem: Skin Integrity: Goal: Risk for impaired skin integrity will decrease Outcome: Progressing   Problem: Education: Goal: Knowledge of the prescribed therapeutic regimen will improve Outcome: Progressing Goal: Understanding of sexual limitations or changes related to disease process or condition will improve Outcome: Progressing Goal: Individualized Educational Video(s) Outcome: Progressing   Problem: Self-Concept: Goal: Communication of feelings regarding changes in body function or appearance will improve Outcome: Progressing   Problem: Skin Integrity: Goal: Demonstration of wound healing without infection will improve Outcome: Progressing   Problem: Education: Goal: Knowledge of condition will improve Outcome: Progressing Goal: Individualized Educational Video(s) Outcome: Progressing Goal: Individualized Newborn Educational Video(s) Outcome: Progressing   Problem: Education: Goal: Knowledge of General Education information will improve Description: Including pain rating scale, medication(s)/side effects and non-pharmacologic comfort measures Outcome: Progressing   Problem: Health Behavior/Discharge Planning: Goal: Ability to manage health-related needs will improve Outcome: Progressing   Problem: Clinical Measurements: Goal: Ability to maintain clinical measurements within normal limits will improve Outcome: Progressing Goal: Will remain free from infection Outcome: Progressing Goal: Diagnostic test results will improve Outcome: Progressing Goal: Respiratory complications will improve Outcome: Progressing Goal: Cardiovascular complication will be avoided Outcome: Progressing   Problem: Nutrition: Goal: Adequate nutrition will be maintained Outcome: Progressing   Problem: Coping: Goal: Level of anxiety will decrease Outcome: Progressing   Problem: Elimination: Goal: Will not experience complications related to bowel motility Outcome: Progressing    Problem: Pain Management: Goal: General experience of comfort will improve Outcome: Progressing   Problem: Safety: Goal: Ability to remain free from injury will improve Outcome: Progressing   Problem: Skin Integrity: Goal: Risk for impaired skin integrity will decrease Outcome: Progressing

## 2023-04-03 NOTE — Lactation Note (Signed)
This note was copied from a baby's chart. Lactation Consultation Note  Patient Name: Christina Cummings Date: 04/03/2023 Age:25 hours  Reason for consult: Follow-up assessment;Late-preterm 34-36.6wks  Follow up visit with P2 mother of LPI. Baby is being bottle fed formula. Mother has DEBP at bedside and states she pump last night and got "some milk". Mother has experience of breastfeeding her other child for 2 months and reports she produced milk and was able to breast feed.   Mother would like a Stork Pump request made. A referral has been made to [facility] on 10/26, however, mother has not heard from [facility].    Discussed the process of milk production, "supply and demand" and the importance of breast stimulation and milk removal in order to make an optimal milk supply.  Discussed with mother to breastfeed 8-12 times in 24 hours (at least every 3 hours), skin to skin and breast feed before formula feeding.  If missed feedings at breast or substituting feeding with formula, advised to hand express and/or pump to remove milk from the breast.    Feeding Mother's Current Feeding Choice: Breast Milk and Formula  Interventions Interventions: Education  Discharge Discharge Education: Engorgement and breast care;Warning signs for feeding baby Pump: Manual Irena Cords Pump request was made today-pending)  Consult Status Consult Status: Complete Date: 04/03/23    Omar Person, RN, [facility] 04/03/2023, 8:35 AM

## 2023-04-03 NOTE — Lactation Note (Signed)
This note was copied from a baby's chart. Lactation Consultation Note  Patient Name: Christina Cummings [id number] Date: 04/03/2023 Age:25 hours Reason for consult: Follow-up assessment;Late-preterm 15-36.6wks  Mother was able to get a Stork Pump ( personal DEBP from DME).    Consult Status Consult Status: Complete Date: 04/03/23    Christina Cummings 04/03/2023, 11:31 AM

## 2023-04-03 NOTE — Plan of Care (Signed)
Problem: Education: Goal: Knowledge of Childbirth will improve 04/03/2023 1007 by Christina Hazel, LPN Outcome: Adequate for Discharge 04/03/2023 0825 by Christina Hazel, LPN Outcome: Progressing Goal: Ability to make informed decisions regarding treatment and plan of care will improve 04/03/2023 1007 by Christina Hazel, LPN Outcome: Adequate for Discharge 04/03/2023 0825 by Christina Hazel, LPN Outcome: Progressing Goal: Ability to state and carry out methods to decrease the pain will improve 04/03/2023 1007 by Christina Hazel, LPN Outcome: Adequate for Discharge 04/03/2023 0825 by Christina Hazel, LPN Outcome: Progressing Goal: Individualized Educational Video(s) 04/03/2023 1007 by Christina Hazel, LPN Outcome: Adequate for Discharge 04/03/2023 0825 by Christina Hazel, LPN Outcome: Progressing   Problem: Coping: Goal: Ability to verbalize concerns and feelings about labor and delivery will improve 04/03/2023 1007 by Christina Hazel, LPN Outcome: Adequate for Discharge 04/03/2023 0825 by Christina Hazel, LPN Outcome: Progressing   Problem: Life Cycle: Goal: Ability to make normal progression through stages of labor will improve 04/03/2023 1007 by Christina Hazel, LPN Outcome: Adequate for Discharge 04/03/2023 0825 by Christina Hazel, LPN Outcome: Progressing Goal: Ability to effectively push during vaginal delivery will improve 04/03/2023 1007 by Christina Hazel, LPN Outcome: Adequate for Discharge 04/03/2023 0825 by Christina Hazel, LPN Outcome: Progressing   Problem: Role Relationship: Goal: Will demonstrate positive interactions with the child 04/03/2023 1007 by Christina Hazel, LPN Outcome: Adequate for Discharge 04/03/2023 0825 by Christina Hazel, LPN Outcome: Progressing   Problem: Safety: Goal: Risk of complications during labor and delivery will decrease 04/03/2023 1007 by Christina Hazel, LPN Outcome: Adequate for Discharge 04/03/2023 0825 by Christina Hazel,  LPN Outcome: Progressing   Problem: Pain Management: Goal: Relief or control of pain from uterine contractions will improve 04/03/2023 1007 by Christina Hazel, LPN Outcome: Adequate for Discharge 04/03/2023 0825 by Christina Hazel, LPN Outcome: Progressing   Problem: Health Behavior/Discharge Planning: Goal: Ability to manage health-related needs will improve 04/03/2023 1007 by Christina Hazel, LPN Outcome: Adequate for Discharge 04/03/2023 0825 by Christina Hazel, LPN Outcome: Progressing   Problem: Clinical Measurements: Goal: Ability to maintain clinical measurements within normal limits will improve 04/03/2023 1007 by Christina Hazel, LPN Outcome: Adequate for Discharge 04/03/2023 0825 by Christina Hazel, LPN Outcome: Progressing Goal: Will remain free from infection 04/03/2023 1007 by Christina Hazel, LPN Outcome: Adequate for Discharge 04/03/2023 0825 by Christina Hazel, LPN Outcome: Progressing Goal: Respiratory complications will improve 04/03/2023 1007 by Christina Hazel, LPN Outcome: Adequate for Discharge 04/03/2023 0825 by Christina Hazel, LPN Outcome: Progressing Goal: Cardiovascular complication will be avoided 04/03/2023 1007 by Christina Hazel, LPN Outcome: Adequate for Discharge 04/03/2023 0825 by Christina Hazel, LPN Outcome: Progressing   Problem: Coping: Goal: Level of anxiety will decrease 04/03/2023 1007 by Christina Hazel, LPN Outcome: Adequate for Discharge 04/03/2023 0825 by Christina Hazel, LPN Outcome: Progressing   Problem: Elimination: Goal: Will not experience complications related to bowel motility 04/03/2023 1007 by Christina Hazel, LPN Outcome: Adequate for Discharge 04/03/2023 0825 by Christina Hazel, LPN Outcome: Progressing   Problem: Pain Management: Goal: General experience of comfort will improve 04/03/2023 1007 by Christina Hazel, LPN Outcome: Adequate for Discharge 04/03/2023 0825 by Christina Hazel, LPN Outcome: Progressing    Problem: Safety: Goal: Ability to remain free from injury will improve 04/03/2023 1007 by Christina Hazel, LPN Outcome: Adequate for Discharge 04/03/2023 0825 by Christina Cummings  A, LPN Outcome: Progressing   Problem: Skin Integrity: Goal: Risk for impaired skin integrity will decrease 04/03/2023 1007 by Christina Hazel, LPN Outcome: Adequate for Discharge 04/03/2023 0825 by Christina Hazel, LPN Outcome: Progressing   Problem: Education: Goal: Knowledge of the prescribed therapeutic regimen will improve 04/03/2023 1007 by Christina Hazel, LPN Outcome: Adequate for Discharge 04/03/2023 0825 by Christina Hazel, LPN Outcome: Progressing Goal: Understanding of sexual limitations or changes related to disease process or condition will improve 04/03/2023 1007 by Christina Hazel, LPN Outcome: Adequate for Discharge 04/03/2023 0825 by Christina Hazel, LPN Outcome: Progressing Goal: Individualized Educational Video(s) 04/03/2023 1007 by Christina Hazel, LPN Outcome: Adequate for Discharge 04/03/2023 0825 by Christina Hazel, LPN Outcome: Progressing   Problem: Self-Concept: Goal: Communication of feelings regarding changes in body function or appearance will improve 04/03/2023 1007 by Christina Hazel, LPN Outcome: Adequate for Discharge 04/03/2023 0825 by Christina Hazel, LPN Outcome: Progressing   Problem: Skin Integrity: Goal: Demonstration of wound healing without infection will improve 04/03/2023 1007 by Christina Hazel, LPN Outcome: Adequate for Discharge 04/03/2023 0825 by Christina Hazel, LPN Outcome: Progressing   Problem: Education: Goal: Knowledge of condition will improve 04/03/2023 1007 by Christina Hazel, LPN Outcome: Adequate for Discharge 04/03/2023 0825 by Christina Hazel, LPN Outcome: Progressing Goal: Individualized Educational Video(s) 04/03/2023 1007 by Christina Hazel, LPN Outcome: Adequate for Discharge 04/03/2023 0825 by Christina Hazel, LPN Outcome:  Progressing Goal: Individualized Newborn Educational Video(s) 04/03/2023 1007 by Christina Hazel, LPN Outcome: Adequate for Discharge 04/03/2023 0825 by Christina Hazel, LPN Outcome: Progressing   Problem: Activity: Goal: Will verbalize the importance of balancing activity with adequate rest periods Outcome: Adequate for Discharge Goal: Ability to tolerate increased activity will improve Outcome: Adequate for Discharge   Problem: Coping: Goal: Ability to identify and utilize available resources and services will improve Outcome: Adequate for Discharge   Problem: Life Cycle: Goal: Chance of risk for complications during the postpartum period will decrease Outcome: Adequate for Discharge   Problem: Role Relationship: Goal: Ability to demonstrate positive interaction with newborn will improve Outcome: Adequate for Discharge   Problem: Education: Goal: Knowledge of General Education information will improve Description: Including pain rating scale, medication(s)/side effects and non-pharmacologic comfort measures 04/03/2023 1007 by Christina Hazel, LPN Outcome: Adequate for Discharge 04/03/2023 0825 by Christina Hazel, LPN Outcome: Progressing   Problem: Health Behavior/Discharge Planning: Goal: Ability to manage health-related needs will improve 04/03/2023 1007 by Christina Hazel, LPN Outcome: Adequate for Discharge 04/03/2023 0825 by Christina Hazel, LPN Outcome: Progressing   Problem: Clinical Measurements: Goal: Ability to maintain clinical measurements within normal limits will improve 04/03/2023 1007 by Christina Hazel, LPN Outcome: Adequate for Discharge 04/03/2023 0825 by Christina Hazel, LPN Outcome: Progressing Goal: Will remain free from infection 04/03/2023 1007 by Christina Hazel, LPN Outcome: Adequate for Discharge 04/03/2023 0825 by Christina Hazel, LPN Outcome: Progressing Goal: Diagnostic test results will improve 04/03/2023 1007 by Christina Hazel, LPN Outcome:  Adequate for Discharge 04/03/2023 0825 by Christina Hazel, LPN Outcome: Progressing Goal: Respiratory complications will improve 04/03/2023 1007 by Christina Hazel, LPN Outcome: Adequate for Discharge 04/03/2023 0825 by Christina Hazel, LPN Outcome: Progressing Goal: Cardiovascular complication will be avoided 04/03/2023 1007 by Christina Hazel, LPN Outcome: Adequate for Discharge 04/03/2023 0825 by Christina Hazel, LPN Outcome: Progressing   Problem: Nutrition: Goal: Adequate nutrition will be maintained 04/03/2023  1007 by Christina Hazel, LPN Outcome: Adequate for Discharge 04/03/2023 0825 by Christina Hazel, LPN Outcome: Progressing   Problem: Coping: Goal: Level of anxiety will decrease 04/03/2023 1007 by Christina Hazel, LPN Outcome: Adequate for Discharge 04/03/2023 0825 by Christina Hazel, LPN Outcome: Progressing   Problem: Elimination: Goal: Will not experience complications related to bowel motility 04/03/2023 1007 by Christina Hazel, LPN Outcome: Adequate for Discharge 04/03/2023 0825 by Christina Hazel, LPN Outcome: Progressing   Problem: Pain Management: Goal: General experience of comfort will improve 04/03/2023 1007 by Christina Hazel, LPN Outcome: Adequate for Discharge 04/03/2023 0825 by Christina Hazel, LPN Outcome: Progressing   Problem: Safety: Goal: Ability to remain free from injury will improve 04/03/2023 1007 by Christina Hazel, LPN Outcome: Adequate for Discharge 04/03/2023 0825 by Christina Hazel, LPN Outcome: Progressing   Problem: Skin Integrity: Goal: Risk for impaired skin integrity will decrease 04/03/2023 1007 by Christina Hazel, LPN Outcome: Adequate for Discharge 04/03/2023 0825 by Christina Hazel, LPN Outcome: Progressing

## 2023-04-19 ENCOUNTER — Telehealth (HOSPITAL_COMMUNITY): Payer: Self-pay | Admitting: *Deleted

## 2023-04-19 NOTE — Telephone Encounter (Signed)
04/19/2023  Name: Christina Cummings MRN: [id number] DOB: [birth year]  Reason for Call:  Transition of Care Hospital Discharge Call  Contact Status: Patient Contact Status: Message  Language assistant needed:          Follow-Up Questions:    Inocente Salles Postnatal Depression Scale:  In the Past 7 Days:    PHQ2-9 Depression Scale:     Discharge Follow-up:    Post-discharge interventions: NA  Salena Saner, RN 04/19/2023 10:09

## 2023-09-20 NOTE — Progress Notes (Addendum)
 [facility]    Christina Cummings is a 28 y.o. female who presents with  Chief Complaint  Patient presents with  . Nose Complaint  .   History obtained from patient .   Assessment & Plan:    Assessment & Plan Seasonal allergic rhinitis due to pollen  Orders: .  levocetirizine (XYZAL) 5 MG tablet; Take 1 tablet (5 mg total) by mouth every evening .  fluticasone  propionate (Flonase  Allergy Relief) 50 mcg/actuation nasal spray; 2 sprays in each nostril daily x1 week then 1-2 sprays in each nostril daily. (use lowest possible dose after week 1)  . - Adequate rest and hydration - Contact PCP or return to clinic if symptoms do not improve in 2-3 days, if symptoms worsen or new symptoms develop including increase in fever, shortness of breath, chest discomfort, or increase in pain. - Flonase  1 spray in each nostril twice a day - may take up to a week for full benefit to be achieved and continue use for at least one month.    Return if symptoms worsen or fail to improve.  Subjective     Nose Complaint Duration of current symptoms:  2 weeks Onset quality:  Slowly over time Associated symptoms: nasal congestion, headache, other, postnasal drip, runny nose, sinus pain and sneezing   Associated symptoms: no myalgias, no chills, no cough, no appetite change, no decreased sense of smell/taste, no diarrhea, no fatigue, no fever, no foreign body, no nasal pain, no nausea, no nosebleeds, no rash, no sore throat, no diaphoresis, no swollen glands and no vomiting   Treatments tried:  Over-the-counter medication (claritin, benadryl ) Worsened by comment:  Outside, at night Response to treatment:  Relieved symptoms Relief: mild relief   Special considerations:  None apply Any patient-supplied information was reviewed and discussed with the patient.: Attestation      No Known Allergies  Review of Systems  Constitutional:  Negative for appetite change, chills, diaphoresis, fatigue  and fever.  HENT:  Positive for congestion, postnasal drip, rhinorrhea, sinus pain and sneezing. Negative for nosebleeds and sore throat.   Respiratory:  Negative for cough, shortness of breath and wheezing.   Cardiovascular:  Negative for chest pain, palpitations and leg swelling.  Gastrointestinal:  Negative for abdominal pain, diarrhea, nausea and vomiting.  Musculoskeletal:  Negative for arthralgias and myalgias.  Skin:  Negative for rash.  Neurological:  Positive for headaches. Negative for dizziness.  Psychiatric/Behavioral:  Negative for dysphoric mood. The patient is not nervous/anxious.      Objective     Vital Signs: BP 107/73 (Site: Right arm, Position: Sitting, Cuff Size: Adult)   Pulse 99   Temp 97.9 F (36.6 C) (Tympanic)   Resp 18   Ht 5' 8 (1.727 m)   Wt 236 lb (107 kg)   LMP 09/04/2023 (Approximate)   SpO2 98%   BMI 35.88 kg/m    Physical Exam Vitals reviewed.  Constitutional:      Appearance: She is well-developed. She is not ill-appearing.  HENT:     Head: Normocephalic and atraumatic.     Right Ear: Ear canal normal. A middle ear effusion is present.     Left Ear: Ear canal normal. A middle ear effusion is present.     Nose: Mucosal edema and rhinorrhea present. No congestion. Rhinorrhea is clear.     Mouth/Throat:     Lips: Pink.     Mouth: Mucous membranes are moist.     Pharynx: Uvula midline. Postnasal  drip present. No pharyngeal swelling, oropharyngeal exudate, posterior oropharyngeal erythema or uvula swelling.     Tonsils: No tonsillar exudate or tonsillar abscesses. 0 on the right. 0 on the left.  Cardiovascular:     Rate and Rhythm: Normal rate and regular rhythm.  Pulmonary:     Effort: Pulmonary effort is normal.     Breath sounds: Normal breath sounds. No decreased breath sounds, wheezing, rhonchi or rales.  Musculoskeletal:     Cervical back: Full passive range of motion without pain and normal range of motion.  Lymphadenopathy:      Cervical: No cervical adenopathy.  Skin:    General: Skin is warm and dry.  Neurological:     General: No focal deficit present.     Mental Status: She is alert and oriented to person, place, and time.     Gait: Gait is intact.  Psychiatric:        Attention and Perception: Attention normal.        Mood and Affect: Mood normal.        Speech: Speech normal.

## 2024-04-22 ENCOUNTER — Ambulatory Visit (HOSPITAL_COMMUNITY)
Admission: EM | Admit: 2024-04-22 | Discharge: 2024-04-22 | Disposition: A | Discharge status: Home / Self Care | Attending: Family Medicine | Admitting: Family Medicine

## 2024-04-22 ENCOUNTER — Encounter (HOSPITAL_COMMUNITY): Payer: Self-pay | Admitting: *Deleted

## 2024-04-22 ENCOUNTER — Ambulatory Visit (INDEPENDENT_AMBULATORY_CARE_PROVIDER_SITE_OTHER)

## 2024-04-22 DIAGNOSIS — M79645 Pain in left finger(s): Secondary | ICD-10-CM

## 2024-04-22 MED ORDER — IBUPROFEN 600 MG PO TABS: 600.0000 mg | ORAL_TABLET | Freq: Three times a day (TID) | ORAL | 0 refills | Status: AC | PRN

## 2024-04-22 NOTE — Discharge Instructions (Signed)
 Your x-rays do not show any broken bones.   Take ibuprofen 800 mg--1 tab every 8 hours as needed for pain.

## 2024-04-22 NOTE — ED Provider Notes (Signed)
 [facility]    CSN: [id number] Arrival date & time: 04/22/24  1235      History   Chief Complaint Chief Complaint  Patient presents with   Finger Injury    HPI Christina Cummings is a 28 y.o. female.   HPI Here for pain in PIP of left index finger. No trauma or fall. Has not taken anything for pain.  NKDA  Has not had a menstrual cycle in a while.  She has the nexplanon.  Past Medical History:  Diagnosis Date   Hx of gonorrhea 06/2017   Medical history non-contributory    Seasonal allergies     Patient Active Problem List   Diagnosis Date Noted   Breech presentation 04/03/2023   Normal labor 03/31/2023   Preterm premature rupture of membranes (PPROM) with unknown onset of labor 03/31/2023   S/P primary low transverse C-section 03/31/2023    Past Surgical History:  Procedure Laterality Date   CESAREAN SECTION N/A 03/31/2023   Procedure: CESAREAN SECTION;  Surgeon: Jayne Vonn DEL, MD;  Location: [facility];  Service: Obstetrics;  Laterality: N/A;   NO PAST SURGERIES      OB History     Gravida  2   Para  2   Term  1   Preterm  1   AB  0   Living  2      SAB  0   IAB  0   Ectopic  0   Multiple  0   Live Births  2            Home Medications    Prior to Admission medications   Medication Sig Start Date End Date Taking? Authorizing Provider  ibuprofen  (ADVIL ) 600 MG tablet Take 1 tablet (600 mg total) by mouth every 8 (eight) hours as needed (pain). 04/22/24  Yes Latrisa Hellums K, MD  etonogestrel (NEXPLANON) 68 MG IMPL implant Inject into the skin.    [provider]  ferrous sulfate  325 (65 FE) MG tablet Take 1 tablet (325 mg total) by mouth every other day. 04/05/23   Loreli Suzen BIRCH, CNM    Family History Family History  Problem Relation Age of Onset   Sleep apnea Father     Social History Social History   Tobacco Use   Smoking status: Former    Current packs/day: 0.50    Types: Cigarettes    Smokeless tobacco: Never  Vaping Use   Vaping status: Never Used  Substance Use Topics   Alcohol use: No   Drug use: No     Allergies   Patient has no known allergies.   Review of Systems Review of Systems   Physical Exam Triage Vital Signs ED Triage Vitals  Encounter Vitals Group     BP 04/22/24 1335 110/74     Girls Systolic BP Percentile --      Girls Diastolic BP Percentile --      Boys Systolic BP Percentile --      Boys Diastolic BP Percentile --      Pulse Rate 04/22/24 1335 92     Resp 04/22/24 1335 16     Temp 04/22/24 1335 99.1 F (37.3 C)     Temp Source 04/22/24 1335 Oral     SpO2 04/22/24 1335 98 %     Weight --      Height --      Head Circumference --      Peak Flow --  Pain Score 04/22/24 1333 7     Pain Loc --      Pain Education --      Exclude from Growth Chart --    No data found.  Updated Vital Signs BP 110/74 (BP Location: Right Arm)   Pulse 92   Temp 99.1 F (37.3 C) (Oral)   Resp 16   LMP  (LMP Unknown) Comment: pt states little spotting sometimes but not a cycle  SpO2 98%   Visual Acuity Right Eye Distance:   Left Eye Distance:   Bilateral Distance:    Right Eye Near:   Left Eye Near:    Bilateral Near:     Physical Exam Vitals reviewed.  Constitutional:      General: She is not in acute distress.    Appearance: She is not ill-appearing, toxic-appearing or diaphoretic.  Musculoskeletal:     Comments: There is some mild swelling around the PIP of the left index finger  Skin:    Coloration: Skin is not jaundiced or pale.  Neurological:     General: No focal deficit present.     Mental Status: She is alert and oriented to person, place, and time.  Psychiatric:        Behavior: Behavior normal.      UC Treatments / Results  Labs (all labs ordered are listed, but only abnormal results are displayed) Labs Reviewed - No data to display  EKG   Radiology DG Finger Index Left Result Date: 04/22/2024 CLINICAL  DATA:  Left index finger pain 2 weeks. EXAM: LEFT INDEX FINGER 2+V COMPARISON:  None Available. FINDINGS: No evidence of acute fracture or dislocation. No significant soft tissue abnormality. IMPRESSION: No acute findings. Electronically Signed   By: Toribio Agreste M.D.   On: 04/22/2024 14:34    Procedures Procedures (including critical care time)  Medications Ordered in UC Medications - No data to display  Initial Impression / Assessment and Plan / UC Course  I have reviewed the triage vital signs and the nursing notes.  Pertinent labs & imaging results that were available during my care of the patient were reviewed by me and considered in my medical decision making (see chart for details).     Xrays are negative. Ibuprofen  600 mg sent in . She is working on getting a pcp. She is given contact info for hand/ortho Final Clinical Impressions(s) / UC Diagnoses   Final diagnoses:  Pain of finger of left hand     Discharge Instructions      Your x-rays do not show any broken bones  Take ibuprofen  800 mg--1 tab every 8 hours as needed for pain.       ED Prescriptions     Medication Sig Dispense Auth. Provider   ibuprofen  (ADVIL ) 600 MG tablet Take 1 tablet (600 mg total) by mouth every 8 (eight) hours as needed (pain). 15 tablet Kaipo Ardis, Sharlet POUR, MD      I have reviewed the PDMP during this encounter.   Vonna Sharlet POUR, MD 04/22/24 [phone]

## 2024-04-22 NOTE — ED Triage Notes (Signed)
 Pt states she has left index finger pain only when moved or pressure is applied, she is unsure what happened to the finger. She thinks the pain has been there about 2 weeks. She has not been taking any meds for pain.
